# Patient Record
Sex: Female | Born: 1980 | Hispanic: Yes | Smoking: Never smoker
Health system: Southern US, Community
[De-identification: ages and names within clinical notes are randomized; demographics above are authoritative.]

## PROBLEM LIST (undated history)

## (undated) DIAGNOSIS — O139 Gestational [pregnancy-induced] hypertension without significant proteinuria, unspecified trimester: Secondary | ICD-10-CM

## (undated) HISTORY — DX: Gestational (pregnancy-induced) hypertension without significant proteinuria, unspecified trimester: O13.9

---

## 2016-07-01 ENCOUNTER — Encounter: Payer: Self-pay | Admitting: Obstetrics & Gynecology

## 2016-07-09 ENCOUNTER — Other Ambulatory Visit (HOSPITAL_COMMUNITY)
Admission: RE | Admit: 2016-07-09 | Discharge: 2016-07-09 | Disposition: A | Discharge status: Home / Self Care | Payer: Medicaid Other | Source: Ambulatory Visit | Attending: Advanced Practice Midwife | Admitting: Advanced Practice Midwife

## 2016-07-09 ENCOUNTER — Encounter: Payer: Self-pay | Admitting: Advanced Practice Midwife

## 2016-07-09 ENCOUNTER — Ambulatory Visit (INDEPENDENT_AMBULATORY_CARE_PROVIDER_SITE_OTHER): Payer: Medicaid Other | Admitting: Advanced Practice Midwife

## 2016-07-09 ENCOUNTER — Encounter: Payer: Self-pay | Admitting: *Deleted

## 2016-07-09 VITALS — BP 108/74 | HR 86 | Wt 185.6 lb

## 2016-07-09 DIAGNOSIS — Z3483 Encounter for supervision of other normal pregnancy, third trimester: Secondary | ICD-10-CM | POA: Diagnosis not present

## 2016-07-09 DIAGNOSIS — O34219 Maternal care for unspecified type scar from previous cesarean delivery: Secondary | ICD-10-CM | POA: Diagnosis not present

## 2016-07-09 DIAGNOSIS — Z1151 Encounter for screening for human papillomavirus (HPV): Secondary | ICD-10-CM | POA: Insufficient documentation

## 2016-07-09 DIAGNOSIS — Z01419 Encounter for gynecological examination (general) (routine) without abnormal findings: Secondary | ICD-10-CM | POA: Diagnosis present

## 2016-07-09 DIAGNOSIS — Z113 Encounter for screening for infections with a predominantly sexual mode of transmission: Secondary | ICD-10-CM | POA: Insufficient documentation

## 2016-07-09 DIAGNOSIS — Z3480 Encounter for supervision of other normal pregnancy, unspecified trimester: Secondary | ICD-10-CM | POA: Insufficient documentation

## 2016-07-09 LAB — POCT URINALYSIS DIP (DEVICE)
BILIRUBIN URINE: NEGATIVE
GLUCOSE, UA: NEGATIVE mg/dL
Hgb urine dipstick: NEGATIVE
Ketones, ur: NEGATIVE mg/dL
LEUKOCYTES UA: NEGATIVE
NITRITE: NEGATIVE
Protein, ur: NEGATIVE mg/dL
Specific Gravity, Urine: 1.015 (ref 1.005–1.030)
UROBILINOGEN UA: 0.2 mg/dL (ref 0.0–1.0)
pH: 7 (ref 5.0–8.0)

## 2016-07-09 LAB — OB RESULTS CONSOLE GC/CHLAMYDIA: GC PROBE AMP, GENITAL: NEGATIVE

## 2016-07-09 MED ORDER — FERROUS FUMARATE 150 MG PO TABS: 1.0000 | ORAL_TABLET | Freq: Every day | ORAL | Status: DC

## 2016-07-09 NOTE — Progress Notes (Signed)
   Subjective:    Carolyn Hanson is a G2P1001 3636w6d being seen today for her first obstetrical visit.  Her obstetrical history is significant for C/S x 1. Patient does intend to breast feed. Pregnancy history fully reviewed.  Patient reports no complaints.  Filed Vitals:   07/09/16 0852  BP: 108/74  Pulse: 86  Weight: 185 lb 9.6 oz (84.188 kg)    HISTORY: OB History  Gravida Para Term Preterm AB SAB TAB Ectopic Multiple Living  2 1 1       1     # Outcome Date GA Lbr Len/2nd Weight Sex Delivery Anes PTL Lv  2 Current           1 Term 10/15/11   6 lb 7 oz (2.92 kg) F CS-LTranv Spinal N Y     Past Medical History  Diagnosis Date  . Pregnancy induced hypertension    Past Surgical History  Procedure Laterality Date  . Cesarean section     Family History  Problem Relation Age of Onset  . Diabetes Mother   . Hypertension Mother   . Diabetes Paternal Grandfather   . Hypertension Paternal Grandfather      Exam    Uterus:     Pelvic Exam:    Perineum: Normal Perineum   Vulva: normal   Vagina:  normal mucosa, curdlike discharge, wet prep done   pH:    Cervix: no bleeding following Pap, no cervical motion tenderness and no lesions   Adnexa: not evaluated   Bony Pelvis: average  System: Breast:  normal appearance, no masses or tenderness   Skin: normal coloration and turgor, no rashes    Neurologic: oriented, normal, normal mood, gait normal; reflexes normal and symmetric   Extremities: normal strength, tone, and muscle mass, ROM of all joints is normal   HEENT sclera clear, anicteric, neck supple with midline trachea and thyroid without masses   Mouth/Teeth mucous membranes moist, pharynx normal without lesions and dental hygiene good   Neck supple and no masses   Cardiovascular: regular rate and rhythm   Respiratory:  appears well, vitals normal, no respiratory distress, acyanotic, normal RR, ear and throat exam is normal, neck free of mass or  lymphadenopathy, chest clear, no wheezing, crepitations, rhonchi, normal symmetric air entry   Abdomen: soft, non-tender; bowel sounds normal; no masses,  no organomegaly   Urinary: urethral meatus normal      Assessment:    Pregnancy: G2P1001 Patient Active Problem List   Diagnosis Date Noted  . Supervision of normal subsequent pregnancy 07/09/2016  . H/O cesarean section complicating pregnancy 07/09/2016        Plan:     Initial labs drawn. Prenatal vitamins. Problem list reviewed and updated. Genetic Screening discussed : declined.  Ultrasound discussed; fetal survey: ordered.  Follow up in 1 weeks.      LEFTWICH-KIRBY, Lena Fieldhouse 07/09/2016

## 2016-07-09 NOTE — Addendum Note (Signed)
Addended by: Cheree DittoGRAHAM, DEMETRICE A on: 07/09/2016 02:32 PM   Modules accepted: Orders

## 2016-07-09 NOTE — Addendum Note (Signed)
Addended by: Garret ReddishBARNES, Arliss Hepburn M on: 07/09/2016 11:59 AM   Modules accepted: Orders

## 2016-07-09 NOTE — Progress Notes (Signed)
Spanish interpreter [phone]#[phone] used

## 2016-07-10 ENCOUNTER — Encounter: Payer: Self-pay | Admitting: *Deleted

## 2016-07-10 LAB — GC/CHLAMYDIA PROBE AMP (~~LOC~~) NOT AT ARMC
CHLAMYDIA, DNA PROBE: NEGATIVE
NEISSERIA GONORRHEA: NEGATIVE

## 2016-07-10 LAB — WET PREP, GENITAL
Clue Cells Wet Prep HPF POC: NONE SEEN
Trich, Wet Prep: NONE SEEN
Yeast Wet Prep HPF POC: NONE SEEN

## 2016-07-10 LAB — PAIN MGMT, PROFILE 6 CONF W/O MM, U
6 ACETYLMORPHINE: NEGATIVE ng/mL (ref <10)
AMPHETAMINES: NEGATIVE ng/mL (ref <500)
Alcohol Metabolites: NEGATIVE ng/mL (ref <500)
Barbiturates: NEGATIVE ng/mL (ref <300)
Benzodiazepines: NEGATIVE ng/mL (ref <100)
Cocaine Metabolite: NEGATIVE ng/mL (ref <150)
Creatinine: 43 mg/dL (ref >=20.0)
MARIJUANA METABOLITE: NEGATIVE ng/mL (ref <20)
Methadone Metabolite: NEGATIVE ng/mL (ref <100)
OXIDANT: NEGATIVE ug/mL (ref <200)
OXYCODONE: NEGATIVE ng/mL (ref <100)
Opiates: NEGATIVE ng/mL (ref <100)
PH: 6.8 (ref 4.5–9.0)
Phencyclidine: NEGATIVE ng/mL (ref <25)
Please note:: 0

## 2016-07-10 LAB — CYTOLOGY - PAP

## 2016-07-11 ENCOUNTER — Ambulatory Visit (HOSPITAL_COMMUNITY)
Admission: RE | Admit: 2016-07-11 | Discharge: 2016-07-11 | Disposition: A | Discharge status: Home / Self Care | Payer: Medicaid Other | Source: Ambulatory Visit | Attending: Advanced Practice Midwife | Admitting: Advanced Practice Midwife

## 2016-07-11 ENCOUNTER — Other Ambulatory Visit: Payer: Self-pay | Admitting: Advanced Practice Midwife

## 2016-07-11 DIAGNOSIS — Z3483 Encounter for supervision of other normal pregnancy, third trimester: Secondary | ICD-10-CM

## 2016-07-11 DIAGNOSIS — Z36 Encounter for antenatal screening of mother: Secondary | ICD-10-CM | POA: Diagnosis not present

## 2016-07-11 DIAGNOSIS — O358XX1 Maternal care for other (suspected) fetal abnormality and damage, fetus 1: Secondary | ICD-10-CM

## 2016-07-11 DIAGNOSIS — O34219 Maternal care for unspecified type scar from previous cesarean delivery: Secondary | ICD-10-CM | POA: Insufficient documentation

## 2016-07-11 DIAGNOSIS — Z3A37 37 weeks gestation of pregnancy: Secondary | ICD-10-CM | POA: Insufficient documentation

## 2016-07-11 DIAGNOSIS — O35EXX1 Maternal care for other (suspected) fetal abnormality and damage, fetal genitourinary anomalies, fetus 1: Secondary | ICD-10-CM

## 2016-07-11 DIAGNOSIS — Z3689 Encounter for other specified antenatal screening: Secondary | ICD-10-CM

## 2016-07-11 LAB — PRENATAL PROFILE (SOLSTAS)
Antibody Screen: NEGATIVE
BASOS PCT: 0 %
Basophils Absolute: 0 cells/uL (ref 0–200)
Eosinophils Absolute: 104 cells/uL (ref 15–500)
Eosinophils Relative: 1 %
HEMATOCRIT: 43.4 % (ref 35.0–45.0)
HEP B S AG: NEGATIVE
HIV: NONREACTIVE
Hemoglobin: 13.8 g/dL (ref 11.7–15.5)
LYMPHS ABS: 1664 {cells}/uL (ref 850–3900)
Lymphocytes Relative: 16 %
MCH: 30.4 pg (ref 27.0–33.0)
MCHC: 31.8 g/dL — AB (ref 32.0–36.0)
MCV: 95.6 fL (ref 80.0–100.0)
MONO ABS: 312 {cells}/uL (ref 200–950)
MPV: 10 fL (ref 7.5–12.5)
Monocytes Relative: 3 %
Neutro Abs: 8320 cells/uL — ABNORMAL HIGH (ref 1500–7800)
Neutrophils Relative %: 80 %
Platelets: 266 10*3/uL (ref 140–400)
RBC: 4.54 MIL/uL (ref 3.80–5.10)
RDW: 13.8 % (ref 11.0–15.0)
Rh Type: POSITIVE
Rubella: 3.8 Index — ABNORMAL HIGH (ref <0.90)
WBC: 10.4 10*3/uL (ref 3.8–10.8)

## 2016-07-11 LAB — CULTURE, OB URINE

## 2016-07-12 LAB — HEMOGLOBINOPATHY EVALUATION
HEMATOCRIT: 43.4 % (ref 35.0–45.0)
Hemoglobin: 13.8 g/dL (ref 11.7–15.5)
Hgb A2 Quant: 2.1 % (ref 1.8–3.5)
Hgb A: 96.9 % (ref >96.0)
MCH: 30.4 pg (ref 27.0–33.0)
MCV: 95.6 fL (ref 80.0–100.0)
RBC: 4.54 MIL/uL (ref 3.80–5.10)
RDW: 13.8 % (ref 11.0–15.0)

## 2016-07-16 DIAGNOSIS — O35EXX0 Maternal care for other (suspected) fetal abnormality and damage, fetal genitourinary anomalies, not applicable or unspecified: Secondary | ICD-10-CM | POA: Insufficient documentation

## 2016-07-16 DIAGNOSIS — O358XX0 Maternal care for other (suspected) fetal abnormality and damage, not applicable or unspecified: Secondary | ICD-10-CM | POA: Insufficient documentation

## 2016-07-16 LAB — GLUCOSE TOLERANCE, 1 HOUR (50G) W/O FASTING: GLUCOSE, 1 HR, GESTATIONAL: 137 mg/dL (ref <140)

## 2016-07-18 ENCOUNTER — Other Ambulatory Visit (HOSPITAL_COMMUNITY)
Admission: RE | Admit: 2016-07-18 | Discharge: 2016-07-18 | Disposition: A | Discharge status: Home / Self Care | Payer: Medicaid Other | Source: Ambulatory Visit | Attending: Family | Admitting: Family

## 2016-07-18 ENCOUNTER — Ambulatory Visit (INDEPENDENT_AMBULATORY_CARE_PROVIDER_SITE_OTHER): Payer: Medicaid Other | Admitting: Student

## 2016-07-18 ENCOUNTER — Telehealth: Payer: Self-pay

## 2016-07-18 VITALS — BP 121/80 | HR 100 | Wt 186.8 lb

## 2016-07-18 DIAGNOSIS — O34219 Maternal care for unspecified type scar from previous cesarean delivery: Secondary | ICD-10-CM

## 2016-07-18 DIAGNOSIS — Z113 Encounter for screening for infections with a predominantly sexual mode of transmission: Secondary | ICD-10-CM | POA: Insufficient documentation

## 2016-07-18 DIAGNOSIS — Z3483 Encounter for supervision of other normal pregnancy, third trimester: Secondary | ICD-10-CM

## 2016-07-18 LAB — POCT URINALYSIS DIP (DEVICE)
BILIRUBIN URINE: NEGATIVE
Glucose, UA: NEGATIVE mg/dL
Hgb urine dipstick: NEGATIVE
Ketones, ur: NEGATIVE mg/dL
NITRITE: NEGATIVE
PH: 7 (ref 5.0–8.0)
PROTEIN: NEGATIVE mg/dL
Specific Gravity, Urine: 1.02 (ref 1.005–1.030)
Urobilinogen, UA: 0.2 mg/dL (ref 0.0–1.0)

## 2016-07-18 NOTE — Telephone Encounter (Signed)
Patient is aware of lab results and has been scheduled for her 3 hour gtt on 07/22/2016.

## 2016-07-18 NOTE — Progress Notes (Signed)
  Subjective:  Carolyn Hanson is a 35 y.o. G2P1001 at 6783w2d being seen today for ongoing prenatal care.  She is currently monitored for the following issues for this high-risk pregnancy: has Supervision of normal subsequent pregnancy; H/O cesarean section complicating pregnancy; and Multicystic dysplastic kidney, fetal, affecting care of mother, antepartum on her problem list.  Patient reports no complaints. Denies contractions, LOF, or vaginal bleeding. Positive fetal movement.   The following portions of the patient's history were reviewed and updated as appropriate: allergies, current medications, past family history, past medical history, past social history, past surgical history and problem list. Problem list updated.  Objective:   Filed Vitals:   07/18/16 0938  BP: 121/80  Pulse: 100  Weight: 186 lb 12.8 oz (84.732 kg)     Fetal Status:   General:  Alert, oriented and cooperative. Patient is in no acute distress.  Skin: Skin is warm and dry. No rash noted.   Cardiovascular: Normal heart rate noted  Respiratory: Normal respiratory effort, no problems with respiration noted  Abdomen: Soft, gravid, appropriate for gestational age. Fundal height 39 cm    Pelvic:  Cervical exam performed  Dilation: Fingertip Effacement (%): Thick Station: -3 Presentation: Vertex   Extremities: Normal range of motion.   Mental Status: Normal mood and affect. Normal behavior. Normal judgment and thought content.   Urinalysis: Negative for protein & glucose  Assessment and Plan:  Pregnancy: G2P1001 at 7783w2d   1. Encounter for supervision of other normal pregnancy in third trimester  - Culture, beta strep (group b only) - [facility]Chlam[facility] amp ()[facility]  Term labor symptoms and general obstetric precautions including but not limited to vaginal bleeding, contractions, leaking of fluid and fetal movement were reviewed in detail with the patient. Please refer to After  Visit Summary for other counseling recommendations.  Return in about 1 week (around 07/25/2016) for Routine OB.  Judeth HornErin Kamaree Berkel, NP

## 2016-07-18 NOTE — Patient Instructions (Signed)
Tercer trimestre de Psychiatristembarazo (Third Trimester of Pregnancy) El tercer trimestre comprende desde la semana29 hasta la semana42, es decir, desde el mes7 hasta el [address]. En este trimestre, el feto crece muy rpido. Hacia el final del noveno mes, el feto mide alrededor de 20pulgadas (45cm) de largo y pesa entre 6y 10libras [phone](2,700y 4,500kg).  CUIDADOS EN EL HOGAR   No fume, no consuma hierbas ni beba alcohol. No tome frmacos que el mdico no haya autorizado.  No consuma ningn producto que contenga tabaco, lo que incluye cigarrillos, tabaco de Theatre managermascar o Administrator, Civil Servicecigarrillos electrnicos. Si necesita ayuda para dejar de fumar, consulte al American Expressmdico. Puede recibir asesoramiento u otro tipo de apoyo para dejar de fumar.  Tome los medicamentos solamente como se lo haya indicado el mdico. Algunos medicamentos son seguros para tomar durante el Psychiatristembarazo y otros no lo son.  Haga ejercicios solamente como se lo haya indicado el mdico. Interrumpa la actividad fsica si comienza a tener calambres.  Ingiera alimentos saludables de Banks Springsmanera regular.  Use un sostn que le brinde buen soporte si sus mamas estn sensibles.  No se d baos de inmersin en agua caliente, baos turcos ni saunas.  Colquese el cinturn de seguridad cuando conduzca.  No coma carne cruda ni queso sin cocinar; evite el contacto con las bandejas sanitarias de los gatos y la tierra que estos animales usan.  Tome las vitaminas prenatales.  Tome entre 1500 y 2000mg  de calcio diariamente comenzando en la semana20 del embarazo [place] el parto.  Pruebe tomar un medicamento que la ayude a defecar (un laxante suave) si el mdico lo autoriza. Consuma ms fibra, que se encuentra en las frutas y verduras frescas y los cereales integrales. Beba suficiente lquido para mantener el pis (orina) claro o de color amarillo plido.  Dese baos de asiento con agua tibia para Engineer, materialsaliviar el dolor o las molestias causadas por las hemorroides. Use una crema  para las hemorroides si el mdico la autoriza.  Si se le hinchan las venas (venas varicosas), use medias de descanso. Levante (eleve) los pies durante 15minutos, 3 o 4veces por Futures traderda. Limite el consumo de sal en su dieta.  No levante objetos pesados, use zapatos de tacones bajos y sintese derecha.  Descanse con las piernas elevadas si tiene calambres o dolor de cintura.  Visite a su dentista si no lo ha Occupational hygienisthecho durante el embarazo. Use un cepillo de cerdas suaves para cepillarse los dientes. Psese el hilo dental con suavidad.  Puede seguir Calpine Corporationmanteniendo relaciones sexuales, a menos que el mdico le indique lo contrario.  No haga viajes de larga distancia, excepto si es obligatorio y solamente con la aprobacin del mdico.  Tome clases prenatales.  Practique ir manejando al hospital.  Prepare el bolso que llevar al hospital.  Prepare la habitacin del beb.  Concurra a los controles mdicos. SOLICITE AYUDA SI: 1. No est segura de si est en trabajo de parto o si ha roto la bolsa de las aguas. 2. Tiene mareos. 3. Siente calambres leves o presin en la parte inferior del abdomen. 4. Sufre un dolor persistente en el abdomen. 5. Tiene malestar estomacal (nuseas), vmitos, o tiene deposiciones acuosas (diarrea). 6. Advierte un olor ftido que proviene de la vagina. 7. Siente dolor al ConocoPhillipsorinar. SOLICITE AYUDA DE INMEDIATO SI:   Tiene fiebre.  Tiene una prdida de lquido por la vagina.  Tiene sangrado o pequeas prdidas vaginales.  Siente dolor intenso o clicos en el abdomen.  Sube o baja de peso rpidamente.  Tiene dificultades para recuperar el aliento y siente dolor en el pecho.  Sbitamente se le hinchan mucho el rostro, las [place], los tobillos, los pies o las piernas.  No ha sentido los movimientos del beb durante Carolyn Hanson.  Siente un dolor de cabeza intenso que no se alivia con medicamentos.  Su visin se modifica.   Esta informacin no tiene Theme park manager  el consejo del mdico. Asegrese de hacerle al mdico cualquier pregunta que tenga.   Document Released: 08/18/2013 Document Revised: 01/06/2015 Elsevier Interactive Patient Education 2016 [employer].   Evaluacin de los movimientos fetales  (Fetal Movement Counts) Nombre del paciente: __________________________________________________ Carolyn Hanson estimada: ____________________ Carolyn Hanson de los movimientos fetales es muy recomendable en los embarazos de alto riesgo, pero tambin es una buena idea que lo hagan todas las Carolyn Hanson. El Firefighter que comience a contarlos a las 28 semanas de [place]. Los movimientos fetales suelen aumentar:   Despus de Animator.  Despus de la actividad fsica.  Despus de comer o beber [employer] o fro.  En reposo. Preste atencin cuando sienta que el beb est ms activo. Esto le ayudar a notar un patrn de ciclos de vigilia y sueo de su beb y cules son los factores que contribuyen a un aumento de los movimientos fetales. Es importante llevar a cabo un recuento de movimientos fetales, al mismo tiempo cada da, cuando el beb normalmente est ms activo.  CMO CONTAR LOS MOVIMIENTOS FETALES 8. Busque un lugar tranquilo y cmodo para sentarse o recostarse sobre el lado izquierdo. Al recostarse sobre su lado izquierdo, le proporciona una mejor circulacin de [place] y oxgeno al beb. 9. Anote el da y la hora en una hoja de papel o en un diario. 10. Comience contando las pataditas, revoloteos, chasquidos, vueltas o pinchazos en un perodo de 2 horas. Debe sentir al menos 10 movimientos en 2 horas. 11. Si no siente 10 movimientos en 2 horas, espere 2  3 horas y cuente de nuevo. Busque cambios en el patrn o si no cuenta lo suficiente en 2 horas. SOLICITE ATENCIN MDICA SI:   Siente menos de 10 pataditas en 2 horas, en dos intentos.  No hay movimientos durante una hora.  El patrn se modifica o le lleva ms tiempo Hospital doctor las 10 pataditas.  Siente que el beb no se mueve como lo hace habitualmente. Fecha: ____________ Movimientos: ____________ Carolyn Hanson inicio: ____________ Carolyn Hanson finalizacin: ____________  Carolyn Hanson: ____________ Movimientos: ____________ Carolyn Hanson inicio: ____________ Carolyn Hanson finalizacin: ____________  Carolyn Hanson: ____________ Movimientos: ____________ Carolyn Hanson inicio: ____________ Carolyn Hanson finalizacin: ____________  Carolyn Hanson: ____________ Movimientos: ____________ Carolyn Hanson inicio: ____________ Carolyn Hanson finalizacin: ____________  Carolyn Hanson: ____________ Movimientos: ____________ Carolyn Hanson inicio: ____________ Carolyn Hanson de finalizacin: ____________  Carolyn Hanson: ____________ Movimientos: ____________ Carolyn Hanson de inicio: ____________ Carolyn Hanson de finalizacin: ____________  Carolyn Hanson: ____________ Movimientos: ____________ Carolyn Hanson de inicio: ____________ Carolyn Hanson de finalizacin: ____________  Carolyn Hanson: ____________ Movimientos: ____________ Carolyn Hanson de inicio: ____________ Carolyn Hanson de finalizacin: ____________  Carolyn Hanson: ____________ Movimientos: ____________ Carolyn Hanson de inicio: ____________ Carolyn Hanson de finalizacin: ____________  Carolyn Hanson: ____________ Movimientos: ____________ Carolyn Hanson de inicio: ____________ Carolyn Hanson de finalizacin: ____________  Carolyn Hanson: ____________ Movimientos: ____________ Carolyn Hanson de inicio: ____________ Carolyn Hanson de finalizacin: ____________  Carolyn Hanson: ____________ Movimientos: ____________ Carolyn Hanson inicio: ____________ Carolyn Hanson finalizacin: ____________  Carolyn Hanson: ____________ Movimientos: ____________ Carolyn Hanson inicio: ____________ Carolyn Hanson finalizacin: ____________  Carolyn Hanson: ____________ Movimientos: ____________ Carolyn Hanson inicio: ____________ Carolyn Hanson finalizacin: ____________  Carolyn Hanson: ____________ Movimientos: ____________  Hora de inicio: ____________ Carolyn Hanson de finalizacin: ____________  Carolyn Hanson: ____________ Movimientos: ____________ Carolyn Hanson inicio: ____________ Carolyn Hanson finalizacin: ____________  Carolyn Hanson: ____________ Movimientos: ____________ Carolyn Hanson inicio:  ____________ Carolyn Hanson finalizacin: ____________  Carolyn Hanson: ____________ Movimientos: ____________ Carolyn Hanson inicio: ____________ Carolyn Hanson finalizacin: ____________  Carolyn Hanson: ____________ Movimientos: ____________ Carolyn Hanson inicio: ____________ Carolyn Hanson finalizacin: ____________  Carolyn Hanson: ____________ Movimientos: ____________ Carolyn Hanson inicio: ____________ Carolyn Hanson de finalizacin: ____________  Carolyn Hanson: ____________ Movimientos: ____________ Carolyn Hanson de inicio: ____________ Carolyn Hanson de finalizacin: ____________  Carolyn Hanson: ____________ Movimientos: ____________ Carolyn Hanson de inicio: ____________ Carolyn Hanson de finalizacin: ____________  Carolyn Hanson: ____________ Movimientos: ____________ Carolyn Hanson de inicio: ____________ Carolyn Hanson de finalizacin: ____________  Carolyn Hanson: ____________ Movimientos: ____________ Carolyn Hanson de inicio: ____________ Carolyn Hanson de finalizacin: ____________  Carolyn Hanson: ____________ Movimientos: ____________ Carolyn Hanson de inicio: ____________ Carolyn Hanson de finalizacin: ____________  Carolyn Hanson: ____________ Movimientos: ____________ Carolyn Hanson de inicio: ____________ Carolyn Hanson de finalizacin: ____________  Carolyn Hanson: ____________ Movimientos: ____________ Carolyn Hanson de inicio: ____________ Carolyn Hanson de finalizacin: ____________  Carolyn Hanson: ____________ Movimientos: ____________ Carolyn Hanson de inicio: ____________ Carolyn Hanson de finalizacin: ____________  Carolyn Hanson: ____________ Movimientos: ____________ Carolyn Hanson de inicio: ____________ Carolyn Hanson de finalizacin: ____________  Carolyn Hanson: ____________ Movimientos: ____________ Carolyn Hanson de inicio: ____________ Carolyn Hanson de finalizacin: ____________  Carolyn Hanson: ____________ Movimientos: ____________ Carolyn Hanson de inicio: ____________ Carolyn Hanson de finalizacin: ____________  Carolyn Hanson: ____________ Movimientos: ____________ Carolyn Hanson de inicio: ____________ Carolyn Hanson de finalizacin: ____________  Carolyn Hanson: ____________ Movimientos: ____________ Carolyn Hanson de inicio: ____________ Carolyn Hanson de finalizacin: ____________  Carolyn Hanson: ____________ Movimientos: ____________ Carolyn Hanson de inicio: ____________ Carolyn Hanson de finalizacin:  ____________  Carolyn Hanson: ____________ Movimientos: ____________ Carolyn Hanson de inicio: ____________ Carolyn Hanson de finalizacin: ____________  Carolyn Hanson: ____________ Movimientos: ____________ Carolyn Hanson de inicio: ____________ Carolyn Hanson de finalizacin: ____________  Carolyn Hanson: ____________ Movimientos: ____________ Carolyn Hanson de inicio: ____________ Carolyn Hanson de finalizacin: ____________  Carolyn Hanson: ____________ Movimientos: ____________ Carolyn Hanson de inicio: ____________ Carolyn Hanson de finalizacin: ____________  Carolyn Hanson: ____________ Movimientos: ____________ Carolyn Hanson de inicio: ____________ Carolyn Hanson de finalizacin: ____________  Carolyn Hanson: ____________ Movimientos: ____________ Carolyn Hanson de inicio: ____________ Carolyn Hanson de finalizacin: ____________  Carolyn Hanson: ____________ Movimientos: ____________ Carolyn Hanson de inicio: ____________ Carolyn Hanson de finalizacin: ____________  Carolyn Hanson: ____________ Movimientos: ____________ Carolyn Hanson de inicio: ____________ Carolyn Hanson de finalizacin: ____________  Carolyn Hanson: ____________ Movimientos: ____________ Carolyn Hanson de inicio: ____________ Carolyn Hanson de finalizacin: ____________  Carolyn Hanson: ____________ Movimientos: ____________ Carolyn Hanson de inicio: ____________ Carolyn Hanson de finalizacin: ____________  Carolyn Hanson: ____________ Movimientos: ____________ Carolyn Hanson de inicio: ____________ Carolyn Hanson de finalizacin: ____________  Carolyn Hanson: ____________ Movimientos: ____________ Carolyn Hanson de inicio: ____________ Carolyn Hanson de finalizacin: ____________  Carolyn Hanson: ____________ Movimientos: ____________ Carolyn Hanson de inicio: ____________ Carolyn Hanson de finalizacin: ____________  Carolyn Hanson: ____________ Movimientos: ____________ Carolyn Hanson de inicio: ____________ Carolyn Hanson de finalizacin: ____________  Carolyn Hanson: ____________ Movimientos: ____________ Carolyn Hanson de inicio: ____________ Carolyn Hanson de finalizacin: ____________  Carolyn Hanson: ____________ Movimientos: ____________ Carolyn Hanson de inicio: ____________ Carolyn Hanson de finalizacin: ____________  Carolyn Hanson: ____________ Movimientos: ____________ Carolyn Hanson de inicio: ____________ Carolyn Hanson de finalizacin: ____________  Carolyn Hanson: ____________  Movimientos: ____________ Carolyn Hanson de inicio: ____________ Carolyn Hanson de finalizacin: ____________  Carolyn Hanson: ____________ Movimientos: ____________ Carolyn Hanson de inicio: ____________ Carolyn Hanson de finalizacin: ____________  Carolyn Hanson: ____________ Movimientos: ____________ Carolyn Hanson de inicio: ____________ Carolyn Hanson de finalizacin: ____________  Carolyn Hanson: ____________ Movimientos: ____________ Carolyn Hanson de inicio: ____________ Carolyn Hanson de finalizacin: ____________  Carolyn Hanson: ____________ Movimientos: ____________ Carolyn Hanson de inicio: ____________ Carolyn Hanson de finalizacin: ____________    Esta informacin no tiene como fin reemplazar el consejo del mdico. Asegrese de hacerle al mdico cualquier pregunta que tenga.   Document Released: 03/24/2008 Document Revised: 12/02/2012 Elsevier Interactive Patient Education [employer]

## 2016-07-19 ENCOUNTER — Telehealth: Payer: Self-pay | Admitting: *Deleted

## 2016-07-19 ENCOUNTER — Inpatient Hospital Stay (HOSPITAL_COMMUNITY)
Admission: AD | Admit: 2016-07-19 | Discharge: 2016-07-22 | DRG: 766 | Disposition: A | Discharge status: Home / Self Care | Payer: Medicaid Other | Source: Ambulatory Visit | Attending: Family Medicine | Admitting: Family Medicine

## 2016-07-19 ENCOUNTER — Encounter (HOSPITAL_COMMUNITY): Payer: Self-pay

## 2016-07-19 DIAGNOSIS — Z8249 Family history of ischemic heart disease and other diseases of the circulatory system: Secondary | ICD-10-CM

## 2016-07-19 DIAGNOSIS — Z3A38 38 weeks gestation of pregnancy: Secondary | ICD-10-CM

## 2016-07-19 DIAGNOSIS — Z3483 Encounter for supervision of other normal pregnancy, third trimester: Secondary | ICD-10-CM

## 2016-07-19 DIAGNOSIS — Z833 Family history of diabetes mellitus: Secondary | ICD-10-CM | POA: Diagnosis not present

## 2016-07-19 DIAGNOSIS — O4290 Premature rupture of membranes, unspecified as to length of time between rupture and onset of labor, unspecified weeks of gestation: Secondary | ICD-10-CM | POA: Diagnosis present

## 2016-07-19 DIAGNOSIS — O34219 Maternal care for unspecified type scar from previous cesarean delivery: Secondary | ICD-10-CM | POA: Diagnosis present

## 2016-07-19 DIAGNOSIS — O4292 Full-term premature rupture of membranes, unspecified as to length of time between rupture and onset of labor: Secondary | ICD-10-CM | POA: Diagnosis present

## 2016-07-19 DIAGNOSIS — O34211 Maternal care for low transverse scar from previous cesarean delivery: Secondary | ICD-10-CM | POA: Diagnosis present

## 2016-07-19 DIAGNOSIS — O35EXX1 Maternal care for other (suspected) fetal abnormality and damage, fetal genitourinary anomalies, fetus 1: Secondary | ICD-10-CM

## 2016-07-19 DIAGNOSIS — O4202 Full-term premature rupture of membranes, onset of labor within 24 hours of rupture: Secondary | ICD-10-CM

## 2016-07-19 DIAGNOSIS — Z23 Encounter for immunization: Secondary | ICD-10-CM | POA: Diagnosis not present

## 2016-07-19 DIAGNOSIS — O358XX1 Maternal care for other (suspected) fetal abnormality and damage, fetus 1: Secondary | ICD-10-CM

## 2016-07-19 LAB — URINE MICROSCOPIC-ADD ON

## 2016-07-19 LAB — POCT FERN TEST: POCT Fern Test: POSITIVE

## 2016-07-19 LAB — URINALYSIS, ROUTINE W REFLEX MICROSCOPIC
Bilirubin Urine: NEGATIVE
GLUCOSE, UA: NEGATIVE mg/dL
Ketones, ur: NEGATIVE mg/dL
LEUKOCYTES UA: NEGATIVE
Nitrite: NEGATIVE
PH: 5.5 (ref 5.0–8.0)
PROTEIN: NEGATIVE mg/dL
Specific Gravity, Urine: <1.005 — ABNORMAL LOW (ref 1.005–1.030)

## 2016-07-19 LAB — CBC
HCT: 39.2 % (ref 36.0–46.0)
HEMOGLOBIN: 13.8 g/dL (ref 12.0–15.0)
MCH: 30.3 pg (ref 26.0–34.0)
MCHC: 35.2 g/dL (ref 30.0–36.0)
MCV: 86.2 fL (ref 78.0–100.0)
PLATELETS: 230 10*3/uL (ref 150–400)
RBC: 4.55 MIL/uL (ref 3.87–5.11)
RDW: 13.2 % (ref 11.5–15.5)
WBC: 10.5 10*3/uL (ref 4.0–10.5)

## 2016-07-19 MED ORDER — SOD CITRATE-CITRIC ACID 500-334 MG/5ML PO SOLN
30.0000 mL | ORAL | Status: DC | PRN
  Administered 2016-07-20: 30 mL via ORAL
  Filled 2016-07-19: qty 15

## 2016-07-19 MED ORDER — OXYTOCIN BOLUS FROM INFUSION: 500.0000 mL | INTRAVENOUS | Status: DC

## 2016-07-19 MED ORDER — OXYCODONE-ACETAMINOPHEN 5-325 MG PO TABS: 2.0000 | ORAL_TABLET | ORAL | Status: DC | PRN

## 2016-07-19 MED ORDER — OXYCODONE-ACETAMINOPHEN 5-325 MG PO TABS: 1.0000 | ORAL_TABLET | ORAL | Status: DC | PRN

## 2016-07-19 MED ORDER — ONDANSETRON HCL 4 MG/2ML IJ SOLN: 4.0000 mg | Freq: Four times a day (QID) | INTRAMUSCULAR | Status: DC | PRN

## 2016-07-19 MED ORDER — OXYTOCIN 40 UNITS IN LACTATED RINGERS INFUSION - SIMPLE MED: 2.5000 [IU]/h | INTRAVENOUS | Status: DC

## 2016-07-19 MED ORDER — LACTATED RINGERS IV SOLN
INTRAVENOUS | Status: DC
  Administered 2016-07-19: 23:00:00 via INTRAVENOUS

## 2016-07-19 MED ORDER — LIDOCAINE HCL (PF) 1 % IJ SOLN: 30.0000 mL | INTRAMUSCULAR | Status: DC | PRN

## 2016-07-19 MED ORDER — ACETAMINOPHEN 325 MG PO TABS: 650.0000 mg | ORAL_TABLET | ORAL | Status: DC | PRN

## 2016-07-19 MED ORDER — LACTATED RINGERS IV SOLN
500.0000 mL | INTRAVENOUS | Status: DC | PRN
Start: 2016-07-19 — End: 2016-07-20
  Administered 2016-07-20: 500 mL via INTRAVENOUS
  Administered 2016-07-20: 1000 mL via INTRAVENOUS

## 2016-07-19 NOTE — Telephone Encounter (Signed)
Received message from pt's niece stating that she had taken pt to [facility] to pick up Rx and it was not available. I returned the call and we discussed the situation.  Pt did not pick up Rx within a week for iron tablets which were prescribed on 7/11.  I explained that the pharmacy should still have the prescription and could fill it now.  She voiced understanding and stated that she will notify the pt.

## 2016-07-19 NOTE — MAU Note (Signed)
Had some vag bleeding and fld leaking about ago. Some abd cramping. Prior c/s but desire TOLAC

## 2016-07-19 NOTE — H&P (Signed)
LABOR AND DELIVERY ADMISSION HISTORY AND PHYSICAL NOTE  Carolyn Hanson is a 35 y.o. female G2P1001 with IUP at 3479w2d by LMP presenting for PROM at 2200. TOLAC, hx of c/s for HTN at 40weeks, did not labor.  She reports positive fetal movement. She denies leakage of fluid or vaginal bleeding.  Prenatal History/Complications:  Past Medical History: Past Medical History  Diagnosis Date  . Pregnancy induced hypertension     Past Surgical History: Past Surgical History  Procedure Laterality Date  . Cesarean section      Obstetrical History: OB History    Gravida Para Term Preterm AB TAB SAB Ectopic Multiple Living   2 1 1       1       Social History: Social History   Social History  . Marital Status: Unknown    Spouse Name: N/A  . Number of Children: N/A  . Years of Education: N/A   Social History Main Topics  . Smoking status: Never Smoker   . Smokeless tobacco: Never Used  . Alcohol Use: No  . Drug Use: No  . Sexual Activity: Not on file   Other Topics Concern  . Not on file   Social History Narrative  . No narrative on file    Family History: Family History  Problem Relation Age of Onset  . Diabetes Mother   . Hypertension Mother   . Diabetes Paternal Grandfather   . Hypertension Paternal Grandfather     Allergies: No Known Allergies  Prescriptions prior to admission  Medication Sig Dispense Refill Last Dose  . Ferrous Fumarate 150 MG TABS Take 1 tablet (150 mg total) by mouth daily. 30 each 0 Taking  . FOLIC ACID PO Take 0.4 mg by mouth daily.   Taking     Review of Systems   All systems reviewed and negative except as stated in HPI  Blood pressure 134/87, pulse 93, temperature 98.3 F (36.8 C), resp. rate 18, height 4' 10.25" (1.48 m), weight 86.002 kg (189 lb 9.6 oz), last menstrual period 10/25/2015. General appearance: alert, cooperative and no distress Lungs: clear to auscultation bilaterally Heart: regular rate and  rhythm Abdomen: soft, non-tender; bowel sounds normal Extremities: No calf swelling or tenderness Presentation: cephalic  Fetal monitoring: cat 1 Uterine activity: ctx q5  Dilation: 1 Effacement (%): Thick Station: -3 Exam by:: Sharen Hintaroline Brewer RNC   Prenatal labs: ABO, Rh: A/POS/-- (07/11 0955) Antibody: NEG (07/11 0955) Rubella: !Error! RPR: NON REAC (07/11 0955)  HBsAg: NEGATIVE (07/11 0955)  HIV: NONREACTIVE (07/11 0955)  GBS:   pending 1 hr Glucola: 137 passed  Genetic screening:  declined Anatomy US: fetal Left multicystic dysplastic kidney  Prenatal Transfer Tool  Maternal Diabetes: No Genetic Screening: Declined Maternal Ultrasounds/Referrals: Normal Fetal Ultrasounds or other Referrals:  None Maternal Substance Abuse:  No Significant Maternal Medications:  None Significant Maternal Lab Results: None  Results for orders placed or performed during the hospital encounter of 07/19/16 (from the past 24 hour(s))  Fern Test   Collection Time: 07/19/16 10:55 PM  Result Value Ref Range   POCT Fern Test Positive = ruptured amniotic membanes     Patient Active Problem List   Diagnosis Date Noted  . Multicystic dysplastic kidney, fetal, affecting care of mother, antepartum 07/16/2016  . Supervision of normal subsequent pregnancy 07/09/2016  . H/O cesarean section complicating pregnancy 07/09/2016    Assessment: Carolyn Hanson is a 35 y.o. G2P1001 at 1779w2d here for PROM@2200  7/21.  #Labor:expectant  management #Pain: undecided #FWB: Cat 1 #ID:  GBS unk, PCR pending #MOF: breast #MOC:IUD #TOLAC  Loni Muse 07/19/2016, 11:00 PM  OB FELLOW HISTORY AND PHYSICAL ATTESTATION  I have seen and examined this patient; I agree with above documentation in the resident's note.    Cherrie Gauze Nicko Daher 07/20/2016, 3:30 AM

## 2016-07-20 ENCOUNTER — Inpatient Hospital Stay (HOSPITAL_COMMUNITY): Payer: Medicaid Other | Admitting: Anesthesiology

## 2016-07-20 ENCOUNTER — Encounter (HOSPITAL_COMMUNITY): Payer: Self-pay | Admitting: Anesthesiology

## 2016-07-20 ENCOUNTER — Encounter (HOSPITAL_COMMUNITY)
Admission: AD | Disposition: A | Discharge status: Home / Self Care | Payer: Self-pay | Source: Ambulatory Visit | Attending: Family Medicine

## 2016-07-20 DIAGNOSIS — Z3A38 38 weeks gestation of pregnancy: Secondary | ICD-10-CM

## 2016-07-20 DIAGNOSIS — O34219 Maternal care for unspecified type scar from previous cesarean delivery: Secondary | ICD-10-CM

## 2016-07-20 LAB — TYPE AND SCREEN
ABO/RH(D): A POS
ANTIBODY SCREEN: NEGATIVE

## 2016-07-20 LAB — ABO/RH: ABO/RH(D): A POS

## 2016-07-20 LAB — RPR: RPR Ser Ql: NONREACTIVE

## 2016-07-20 LAB — CULTURE, BETA STREP (GROUP B ONLY)

## 2016-07-20 LAB — OB RESULTS CONSOLE GBS: GBS: NEGATIVE

## 2016-07-20 LAB — GROUP B STREP BY PCR: GROUP B STREP BY PCR: NEGATIVE

## 2016-07-20 SURGERY — Surgical Case
Anesthesia: Epidural

## 2016-07-20 MED ORDER — LACTATED RINGERS IV SOLN
INTRAVENOUS | Status: DC
Start: 2016-07-20 — End: 2016-07-22
  Administered 2016-07-21 (×2): via INTRAVENOUS

## 2016-07-20 MED ORDER — FENTANYL CITRATE (PF) 100 MCG/2ML IJ SOLN
100.0000 ug | INTRAMUSCULAR | Status: DC | PRN
  Administered 2016-07-20 (×2): 100 ug via INTRAVENOUS
  Filled 2016-07-20 (×3): qty 2

## 2016-07-20 MED ORDER — SODIUM CHLORIDE 0.9 % IR SOLN
Status: DC | PRN
  Administered 2016-07-20: 1000 mL

## 2016-07-20 MED ORDER — SODIUM BICARBONATE 8.4 % IV SOLN
INTRAVENOUS | Status: DC | PRN
  Administered 2016-07-20: 5 mL via EPIDURAL

## 2016-07-20 MED ORDER — DIPHENHYDRAMINE HCL 50 MG/ML IJ SOLN: 12.5000 mg | INTRAMUSCULAR | Status: DC | PRN

## 2016-07-20 MED ORDER — LACTATED RINGERS IV SOLN
INTRAVENOUS | Status: DC
  Administered 2016-07-20: 150 mL/h via INTRAUTERINE

## 2016-07-20 MED ORDER — SIMETHICONE 80 MG PO CHEW: 80.0000 mg | CHEWABLE_TABLET | ORAL | Status: DC | PRN

## 2016-07-20 MED ORDER — OXYCODONE-ACETAMINOPHEN 5-325 MG PO TABS: 1.0000 | ORAL_TABLET | ORAL | Status: DC | PRN

## 2016-07-20 MED ORDER — KETOROLAC TROMETHAMINE 30 MG/ML IJ SOLN: 30.0000 mg | Freq: Four times a day (QID) | INTRAMUSCULAR | Status: AC | PRN

## 2016-07-20 MED ORDER — SENNOSIDES-DOCUSATE SODIUM 8.6-50 MG PO TABS
2.0000 | ORAL_TABLET | ORAL | Status: DC
  Administered 2016-07-20 – 2016-07-21 (×2): 2 via ORAL
  Filled 2016-07-20 (×2): qty 2

## 2016-07-20 MED ORDER — SODIUM CHLORIDE 0.9% FLUSH: 3.0000 mL | INTRAVENOUS | Status: DC | PRN

## 2016-07-20 MED ORDER — OXYTOCIN 40 UNITS IN LACTATED RINGERS INFUSION - SIMPLE MED: 2.5000 [IU]/h | INTRAVENOUS | Status: AC

## 2016-07-20 MED ORDER — PROMETHAZINE HCL 25 MG/ML IJ SOLN: 6.2500 mg | INTRAMUSCULAR | Status: DC | PRN

## 2016-07-20 MED ORDER — NALOXONE HCL 0.4 MG/ML IJ SOLN: 0.4000 mg | INTRAMUSCULAR | Status: DC | PRN

## 2016-07-20 MED ORDER — DEXAMETHASONE SODIUM PHOSPHATE 10 MG/ML IJ SOLN
INTRAMUSCULAR | Status: AC
  Filled 2016-07-20: qty 1

## 2016-07-20 MED ORDER — MEPERIDINE HCL 25 MG/ML IJ SOLN: 6.2500 mg | INTRAMUSCULAR | Status: DC | PRN

## 2016-07-20 MED ORDER — EPHEDRINE 5 MG/ML INJ: 10.0000 mg | INTRAVENOUS | Status: DC | PRN

## 2016-07-20 MED ORDER — NALBUPHINE HCL 10 MG/ML IJ SOLN: 5.0000 mg | Freq: Once | INTRAMUSCULAR | Status: DC | PRN

## 2016-07-20 MED ORDER — DIPHENHYDRAMINE HCL 25 MG PO CAPS
25.0000 mg | ORAL_CAPSULE | ORAL | Status: DC | PRN
  Filled 2016-07-20: qty 1

## 2016-07-20 MED ORDER — SCOPOLAMINE 1 MG/3DAYS TD PT72: 1.0000 | MEDICATED_PATCH | Freq: Once | TRANSDERMAL | Status: DC

## 2016-07-20 MED ORDER — ONDANSETRON HCL 4 MG/2ML IJ SOLN: 4.0000 mg | Freq: Three times a day (TID) | INTRAMUSCULAR | Status: DC | PRN

## 2016-07-20 MED ORDER — TETANUS-DIPHTH-ACELL PERTUSSIS 5-2.5-18.5 LF-MCG/0.5 IM SUSP
0.5000 mL | Freq: Once | INTRAMUSCULAR | Status: AC
  Administered 2016-07-21: 0.5 mL via INTRAMUSCULAR
  Filled 2016-07-20: qty 0.5

## 2016-07-20 MED ORDER — FENTANYL 2.5 MCG/ML BUPIVACAINE 1/10 % EPIDURAL INFUSION (WH - ANES)
14.0000 mL/h | INTRAMUSCULAR | Status: DC | PRN
  Administered 2016-07-20: 14 mL/h via EPIDURAL
  Filled 2016-07-20: qty 125

## 2016-07-20 MED ORDER — ZOLPIDEM TARTRATE 5 MG PO TABS: 5.0000 mg | ORAL_TABLET | Freq: Every evening | ORAL | Status: DC | PRN

## 2016-07-20 MED ORDER — EPHEDRINE 5 MG/ML INJ
10.0000 mg | INTRAVENOUS | Status: DC | PRN
  Filled 2016-07-20: qty 4

## 2016-07-20 MED ORDER — OXYTOCIN 10 UNIT/ML IJ SOLN
INTRAMUSCULAR | Status: AC
  Filled 2016-07-20: qty 4

## 2016-07-20 MED ORDER — NALOXONE HCL 2 MG/2ML IJ SOSY
1.0000 ug/kg/h | PREFILLED_SYRINGE | INTRAVENOUS | Status: DC | PRN
  Filled 2016-07-20: qty 2

## 2016-07-20 MED ORDER — LACTATED RINGERS IV SOLN: 500.0000 mL | Freq: Once | INTRAVENOUS | Status: DC

## 2016-07-20 MED ORDER — NALBUPHINE HCL 10 MG/ML IJ SOLN
5.0000 mg | INTRAMUSCULAR | Status: DC | PRN
  Administered 2016-07-20: 5 mg via INTRAVENOUS
  Filled 2016-07-20: qty 1

## 2016-07-20 MED ORDER — LACTATED RINGERS IV SOLN: INTRAVENOUS | Status: DC

## 2016-07-20 MED ORDER — NALBUPHINE HCL 10 MG/ML IJ SOLN: 5.0000 mg | INTRAMUSCULAR | Status: DC | PRN

## 2016-07-20 MED ORDER — PHENYLEPHRINE 40 MCG/ML (10ML) SYRINGE FOR IV PUSH (FOR BLOOD PRESSURE SUPPORT): 80.0000 ug | PREFILLED_SYRINGE | INTRAVENOUS | Status: DC | PRN

## 2016-07-20 MED ORDER — DIBUCAINE 1 % RE OINT: 1.0000 "application " | TOPICAL_OINTMENT | RECTAL | Status: DC | PRN

## 2016-07-20 MED ORDER — CEFAZOLIN SODIUM-DEXTROSE 2-4 GM/100ML-% IV SOLN
INTRAVENOUS | Status: AC
  Filled 2016-07-20: qty 100

## 2016-07-20 MED ORDER — PHENYLEPHRINE 40 MCG/ML (10ML) SYRINGE FOR IV PUSH (FOR BLOOD PRESSURE SUPPORT)
80.0000 ug | PREFILLED_SYRINGE | INTRAVENOUS | Status: DC | PRN
  Administered 2016-07-20: 80 ug via INTRAVENOUS

## 2016-07-20 MED ORDER — ACETAMINOPHEN 325 MG PO TABS
650.0000 mg | ORAL_TABLET | ORAL | Status: DC | PRN
  Administered 2016-07-21 (×2): 650 mg via ORAL
  Filled 2016-07-20 (×2): qty 2

## 2016-07-20 MED ORDER — PHENYLEPHRINE 40 MCG/ML (10ML) SYRINGE FOR IV PUSH (FOR BLOOD PRESSURE SUPPORT)
80.0000 ug | PREFILLED_SYRINGE | INTRAVENOUS | Status: DC | PRN
  Filled 2016-07-20 (×3): qty 10

## 2016-07-20 MED ORDER — ONDANSETRON HCL 4 MG/2ML IJ SOLN
INTRAMUSCULAR | Status: AC
  Filled 2016-07-20: qty 2

## 2016-07-20 MED ORDER — EPHEDRINE 5 MG/ML INJ
10.0000 mg | INTRAVENOUS | Status: DC | PRN
  Administered 2016-07-20: 10 mg via INTRAVENOUS

## 2016-07-20 MED ORDER — CEFAZOLIN SODIUM-DEXTROSE 2-3 GM-% IV SOLR
INTRAVENOUS | Status: DC | PRN
Start: 2016-07-20 — End: 2016-07-20
  Administered 2016-07-20: 2 g via INTRAVENOUS

## 2016-07-20 MED ORDER — OXYTOCIN 10 UNIT/ML IJ SOLN
40.0000 [IU] | INTRAVENOUS | Status: DC | PRN
  Administered 2016-07-20: 40 [IU] via INTRAVENOUS

## 2016-07-20 MED ORDER — FENTANYL CITRATE (PF) 100 MCG/2ML IJ SOLN: 25.0000 ug | INTRAMUSCULAR | Status: DC | PRN

## 2016-07-20 MED ORDER — KETOROLAC TROMETHAMINE 30 MG/ML IJ SOLN
30.0000 mg | Freq: Four times a day (QID) | INTRAMUSCULAR | Status: DC | PRN
  Administered 2016-07-20: 30 mg via INTRAMUSCULAR

## 2016-07-20 MED ORDER — SCOPOLAMINE 1 MG/3DAYS TD PT72
MEDICATED_PATCH | TRANSDERMAL | Status: DC | PRN
  Administered 2016-07-20: 1 via TRANSDERMAL

## 2016-07-20 MED ORDER — DEXAMETHASONE SODIUM PHOSPHATE 10 MG/ML IJ SOLN
INTRAMUSCULAR | Status: DC | PRN
  Administered 2016-07-20: 8 mg via INTRAVENOUS

## 2016-07-20 MED ORDER — WITCH HAZEL-GLYCERIN EX PADS: 1.0000 "application " | MEDICATED_PAD | CUTANEOUS | Status: DC | PRN

## 2016-07-20 MED ORDER — MORPHINE SULFATE (PF) 0.5 MG/ML IJ SOLN
INTRAMUSCULAR | Status: AC
  Filled 2016-07-20: qty 10

## 2016-07-20 MED ORDER — MENTHOL 3 MG MT LOZG: 1.0000 | LOZENGE | OROMUCOSAL | Status: DC | PRN

## 2016-07-20 MED ORDER — MORPHINE SULFATE (PF) 0.5 MG/ML IJ SOLN
INTRAMUSCULAR | Status: DC | PRN
  Administered 2016-07-20: 4 mg via EPIDURAL

## 2016-07-20 MED ORDER — SCOPOLAMINE 1 MG/3DAYS TD PT72
1.0000 | MEDICATED_PATCH | Freq: Once | TRANSDERMAL | Status: DC
  Filled 2016-07-20: qty 1

## 2016-07-20 MED ORDER — OXYCODONE-ACETAMINOPHEN 5-325 MG PO TABS: 2.0000 | ORAL_TABLET | ORAL | Status: DC | PRN

## 2016-07-20 MED ORDER — COCONUT OIL OIL: 1.0000 "application " | TOPICAL_OIL | Status: DC | PRN

## 2016-07-20 MED ORDER — SCOPOLAMINE 1 MG/3DAYS TD PT72
MEDICATED_PATCH | TRANSDERMAL | Status: AC
  Filled 2016-07-20: qty 1

## 2016-07-20 MED ORDER — SIMETHICONE 80 MG PO CHEW
80.0000 mg | CHEWABLE_TABLET | Freq: Three times a day (TID) | ORAL | Status: DC
  Administered 2016-07-21 – 2016-07-22 (×4): 80 mg via ORAL
  Filled 2016-07-20 (×4): qty 1

## 2016-07-20 MED ORDER — SIMETHICONE 80 MG PO CHEW
80.0000 mg | CHEWABLE_TABLET | ORAL | Status: DC
  Administered 2016-07-20 – 2016-07-21 (×2): 80 mg via ORAL
  Filled 2016-07-20 (×2): qty 1

## 2016-07-20 MED ORDER — DIPHENHYDRAMINE HCL 25 MG PO CAPS: 25.0000 mg | ORAL_CAPSULE | Freq: Four times a day (QID) | ORAL | Status: DC | PRN

## 2016-07-20 MED ORDER — LACTATED RINGERS IV SOLN: 125.0000 mL/h | INTRAVENOUS | Status: DC

## 2016-07-20 MED ORDER — KETOROLAC TROMETHAMINE 30 MG/ML IJ SOLN
INTRAMUSCULAR | Status: AC
  Filled 2016-07-20: qty 1

## 2016-07-20 MED ORDER — ONDANSETRON HCL 4 MG/2ML IJ SOLN
INTRAMUSCULAR | Status: DC | PRN
  Administered 2016-07-20: 4 mg via INTRAVENOUS

## 2016-07-20 MED ORDER — OXYTOCIN 40 UNITS IN LACTATED RINGERS INFUSION - SIMPLE MED
1.0000 m[IU]/min | INTRAVENOUS | Status: DC
  Administered 2016-07-20: 2 m[IU]/min via INTRAVENOUS
  Filled 2016-07-20: qty 1000

## 2016-07-20 MED ORDER — DIPHENHYDRAMINE HCL 25 MG PO CAPS: 25.0000 mg | ORAL_CAPSULE | ORAL | Status: DC | PRN

## 2016-07-20 MED ORDER — IBUPROFEN 600 MG PO TABS
600.0000 mg | ORAL_TABLET | Freq: Four times a day (QID) | ORAL | Status: DC
  Administered 2016-07-20 – 2016-07-22 (×7): 600 mg via ORAL
  Filled 2016-07-20 (×7): qty 1

## 2016-07-20 MED ORDER — PRENATAL MULTIVITAMIN CH
1.0000 | ORAL_TABLET | Freq: Every day | ORAL | Status: DC
  Administered 2016-07-21 – 2016-07-22 (×2): 1 via ORAL
  Filled 2016-07-20 (×2): qty 1

## 2016-07-20 MED ORDER — TERBUTALINE SULFATE 1 MG/ML IJ SOLN: 0.2500 mg | Freq: Once | INTRAMUSCULAR | Status: DC | PRN

## 2016-07-20 MED ORDER — LACTATED RINGERS IV SOLN
INTRAVENOUS | Status: DC | PRN
  Administered 2016-07-20: 16:00:00 via INTRAVENOUS

## 2016-07-20 MED ORDER — KETOROLAC TROMETHAMINE 30 MG/ML IJ SOLN: 30.0000 mg | Freq: Four times a day (QID) | INTRAMUSCULAR | Status: DC | PRN

## 2016-07-20 MED ORDER — LACTATED RINGERS IV SOLN
500.0000 mL | Freq: Once | INTRAVENOUS | Status: AC
  Administered 2016-07-20 (×2): via INTRAVENOUS

## 2016-07-20 MED ORDER — LIDOCAINE HCL (PF) 1 % IJ SOLN
INTRAMUSCULAR | Status: DC | PRN
  Administered 2016-07-20 (×2): 4 mL via EPIDURAL

## 2016-07-20 SURGICAL SUPPLY — 31 items
BENZOIN TINCTURE PRP APPL 2/3 (GAUZE/BANDAGES/DRESSINGS) ×3 IMPLANT
CHLORAPREP W/TINT 26ML (MISCELLANEOUS) ×3 IMPLANT
CLAMP CORD UMBIL (MISCELLANEOUS) ×3 IMPLANT
CLOSURE WOUND 1/2 X4 (GAUZE/BANDAGES/DRESSINGS) ×1
CLOTH BEACON ORANGE TIMEOUT ST (SAFETY) ×3 IMPLANT
DRSG OPSITE POSTOP 4X10 (GAUZE/BANDAGES/DRESSINGS) ×3 IMPLANT
DRSG PAD ABDOMINAL 8X10 ST (GAUZE/BANDAGES/DRESSINGS) ×3 IMPLANT
ELECT REM PT RETURN 9FT ADLT (ELECTROSURGICAL) ×3
ELECTRODE REM PT RTRN 9FT ADLT (ELECTROSURGICAL) ×1 IMPLANT
GLOVE BIOGEL PI IND STRL 7.0 (GLOVE) ×1 IMPLANT
GLOVE BIOGEL PI IND STRL 7.5 (GLOVE) ×2 IMPLANT
GLOVE BIOGEL PI INDICATOR 7.0 (GLOVE) ×2
GLOVE BIOGEL PI INDICATOR 7.5 (GLOVE) ×4
GLOVE ECLIPSE 7.5 STRL STRAW (GLOVE) ×3 IMPLANT
GOWN STRL REUS W/TWL LRG LVL3 (GOWN DISPOSABLE) ×9 IMPLANT
NS IRRIG 1000ML POUR BTL (IV SOLUTION) ×3 IMPLANT
PACK C SECTION WH (CUSTOM PROCEDURE TRAY) ×3 IMPLANT
PAD OB MATERNITY 4.3X12.25 (PERSONAL CARE ITEMS) ×3 IMPLANT
PENCIL SMOKE EVAC W/HOLSTER (ELECTROSURGICAL) ×3 IMPLANT
RTRCTR C-SECT PINK 25CM LRG (MISCELLANEOUS) ×3 IMPLANT
SPONGE GAUZE 4X4 12PLY STER LF (GAUZE/BANDAGES/DRESSINGS) ×6 IMPLANT
STRIP CLOSURE SKIN 1/2X4 (GAUZE/BANDAGES/DRESSINGS) ×2 IMPLANT
SUT PLAIN 2 0 XLH (SUTURE) ×3 IMPLANT
SUT VIC AB 0 CTX 36 (SUTURE) ×8
SUT VIC AB 0 CTX36XBRD ANBCTRL (SUTURE) ×4 IMPLANT
SUT VIC AB 2-0 CT1 27 (SUTURE) ×2
SUT VIC AB 2-0 CT1 TAPERPNT 27 (SUTURE) ×1 IMPLANT
SUT VIC AB 4-0 KS 27 (SUTURE) ×3 IMPLANT
TAPE CLOTH SURG 4X10 WHT LF (GAUZE/BANDAGES/DRESSINGS) ×3 IMPLANT
TOWEL OR 17X24 6PK STRL BLUE (TOWEL DISPOSABLE) ×3 IMPLANT
TRAY FOLEY CATH SILVER 14FR (SET/KITS/TRAYS/PACK) ×3 IMPLANT

## 2016-07-20 NOTE — Anesthesia Preprocedure Evaluation (Signed)
Anesthesia Evaluation  Patient identified by MRN, date of birth, ID band Patient awake    Reviewed: Allergy & Precautions, NPO status , Patient's Chart, lab work & pertinent test results  Airway Mallampati: II  TM Distance: >3 FB Neck ROM: Full    Dental  (+) Teeth Intact, Dental Advisory Given   Pulmonary neg pulmonary ROS,    breath sounds clear to auscultation       Cardiovascular hypertension,  Rhythm:Regular Rate:Normal     Neuro/Psych negative neurological ROS  negative psych ROS   GI/Hepatic negative GI ROS, Neg liver ROS,   Endo/Other  negative endocrine ROS  Renal/GU   negative genitourinary   Musculoskeletal negative musculoskeletal ROS (+)   Abdominal   Peds negative pediatric ROS (+)  Hematology negative hematology ROS (+)   Anesthesia Other Findings   Reproductive/Obstetrics (+) Pregnancy                             Lab Results  Component Value Date   WBC 10.5 07/19/2016   HGB 13.8 07/19/2016   HCT 39.2 07/19/2016   MCV 86.2 07/19/2016   PLT 230 07/19/2016   No results found for: INR, PROTIME   Anesthesia Physical Anesthesia Plan  ASA: III  Anesthesia Plan: Epidural   Post-op Pain Management:    Induction:   Airway Management Planned:   Additional Equipment:   Intra-op Plan:   Post-operative Plan:   Informed Consent: I have reviewed the patients History and Physical, chart, labs and discussed the procedure including the risks, benefits and alternatives for the proposed anesthesia with the patient or authorized representative who has indicated his/her understanding and acceptance.     Plan Discussed with:   Anesthesia Plan Comments:         Anesthesia Quick Evaluation

## 2016-07-20 NOTE — Progress Notes (Signed)
Patient ID: Carolyn Hanson, female   DOB: 1981/09/01, 35 y.o.   MRN: [id number]  Patient having recurrent lates with every contraction.  Unable to start pitocin.  Ctxs every 8 minutes.  Also had third prolonged deceleration.  The risks of cesarean section discussed with the patient included but were not limited to: bleeding which may require transfusion or reoperation; infection which may require antibiotics; injury to bowel, bladder, ureters or other surrounding organs; injury to the fetus; need for additional procedures including hysterectomy in the event of a life-threatening hemorrhage; placental abnormalities wth subsequent pregnancies, incisional problems, thromboembolic phenomenon and other postoperative/anesthesia complications. The patient concurred with the proposed plan, giving informed written consent for the procedure.   Patient has been NPO since this AM - she will remain NPO for procedure. Anesthesia and OR aware.  Preoperative prophylactic Ancef ordered on call to the OR.  To OR when ready.  Levie Heritage, DO 07/20/2016 3:18 PM

## 2016-07-20 NOTE — Op Note (Signed)
Cesarean Section Operative Report  Ciel Boeckmann  07/19/2016 - 07/20/2016  Indications: Failed VBAC and Fetal intolerance to labor   Pre-operative Diagnosis: repeat cesarean section for fetal intolerance to labor.   Post-operative Diagnosis: Same   Surgeon: Surgeon(s) and Role:    * Levie Heritage, DO - Primary    * Jen Mow, DO - Assistant  Attending Attestation: I was present and scrubbed for the entire procedure.   Assistants: Jen Mow, DO  Anesthesia: epidural    Estimated Blood Loss: 500 ml  Total IV Fluids: 2700 ml LR  Urine Output:: 200 ml clear urine  Specimens: Placenta  Findings: Viable female infant in cephalic presentation; Apgars 9/9; weight pending; arterial cord pH - not sent; light meconium amniotic fluid; intact placenta with three vessel cord; normal uterus, fallopian tubes and ovaries bilaterally.  Baby condition / location:  Couplet care / Skin to Skin   Complications: no complications  Indications: Maribeth Beltz is a 35 y.o. G2P1001 with an IUP [redacted]w[redacted]d presenting with PROM and TOLAC, resulting in failed TOLAC due to fetal intolerance to labor.  The risks, benefits, complications, treatment options, and exected outcomes were discussed with the patient . The patient dwith the proposed plan, giving informed consent. identified as Nicoletta Ba and the procedure verified as C-Section Delivery.  Procedure Details:  The patient was taken back to the operative suite where epidural anesthesia bolus was placed.  A time out was held and the above information confirmed.   After induction of anesthesia, the patient was draped and prepped in the usual sterile manner and placed in a dorsal supine position with a leftward tilt. A Pfannenstiel incision was made and carried down through the subcutaneous tissue to the fascia. Fascial incision was made and extended transversely. The fascia was separated from the underlying  rectus tissue superiorly and inferiorly. The peritoneum was identified and entered and extended longitudinally. Alexis retractor was placed. A low transverse uterine incision was made and extended bluntly. Delivered from cephalic presentation was a viable infant with Apgars and weight as above.  After waiting 60 seconds for delayed cord cutting, the umbilical cord was clamped and cut cord blood was obtained for evaluation. Cord ph was not sent. The placenta was removed Intact and appeared normal. The uterine outline, tubes and ovaries appeared normal. The uterine incision was closed with running locked sutures of 0Vicryl, with repair of an extension to the right uterine incision with 0 Vicryl, and with an imbricating layer of the same.   Hemostasis was observed. The peritoneum was closed with 2-0 Vicryl. The rectus muscles were examined and hemostasis observed. The fascia was then reapproximated with running sutures of 0Vicryl. The subcuticular closure was not needed. The skin was closed with 4-0Vicryl.   Instrument, sponge, and needle counts were correct prior the abdominal closure and were correct at the conclusion of the case.     Disposition: PACU - hemodynamically stable.   Maternal Condition: stable       SignedJustice Deeds 07/20/2016 5:00 PM

## 2016-07-20 NOTE — Consult Note (Signed)
[facility]  Delivery Note:  C-section       07/20/2016  3:55 PM  I was called to the operating room at the request of the patient's obstetrician (Dr. Stinson) for a repeat c-section after fetal intolerance of labor during a TOLAC.  PRENATAL HX:  This is a 35 y/o G2P1001 at 38 and 3/[redacted] weeks gestation who was admitted yesterday for PPROM at 2200 (~18 hours).  The fetus began to have non-reassuring heart tones after a TOLAC, so infant delivered by c-section.  Terminal meconium noted.  Her pregnancy is complicated by fetal left multicystic dysplastic kidney with a normal appearing right kidney.   DELIVERY:  Infant was vigorous at delivery, requiring no resuscitation other than standard warming, drying and stimulation.  APGARs 9 and 9.  Exam within normal limits.  After 5 minutes, baby left with nurse to assist parents with skin-to-skin care.  Given prenatal findings, recommend post-natal renal ultrasound.    _____________________ Electronically Signed By: Sharita Bienaime, MD Neonatologist  

## 2016-07-20 NOTE — Progress Notes (Signed)
Carolyn Hanson Carlynn Purl is a 35 y.o. G2P1001 at [redacted]w[redacted]d by LMP admitted for rupture of membranes and labor.  Subjective: Patient seen & evaluated for variable decelerations. Interpretor in the room. Patient with oxygen mask on and laying right decubitus position. Epidural in place and patient is not feeling contractions.   Objective: BP 110/60 mmHg  Pulse 78  Temp(Src) 98.4 F (36.9 C) (Oral)  Resp 20  Ht 4\' 11"  (1.499 m)  Wt 189 lb (85.73 kg)  BMI 38.15 kg/m2  SpO2 100%  LMP 10/25/2015 (Exact Date)   Total I/O In: -  Out: 100 [Urine:100]  FHT:  FHR: 140 bpm, variability: moderate,  accelerations:  Present,  decelerations:  Present variables to 90, <1 minute UC:   regular, every 3-4 minutes; Pitocin at 6 SVE:   Dilation: 5 Effacement (%): 80 Station: -2 Exam by:: Valentina Lucks, RN  Bulging bag present  AROM performed under epidural anesthesia, thin meconium fluid returned. No change in SVE after AROM. IUPC placed.   About 15 minutes after AROM, patient had a prolonged variable deceleration nadir to 90 for about 90 seconds, with return to baseline. Amnioinfusion started.   Variable decels persisted, nadirs to 90 with recovery to baseline, pitocin stopped at about 12:45PM.   Labs: Lab Results  Component Value Date   WBC 10.5 07/19/2016   HGB 13.8 07/19/2016   HCT 39.2 07/19/2016   MCV 86.2 07/19/2016   PLT 230 07/19/2016    Assessment / Plan: Augmentation of labor  Labor: Not progressing, with some fetal intolerance. Pitocin stopped, oxygen mask on patient, amnioinfusion continued. Preeclampsia:  no signs or symptoms of toxicity Fetal Wellbeing:  Category II Pain Control:  Epidural I/D:  n/a Anticipated MOD:  NSVD  Jen Mow 07/20/2016, 12:40 PM

## 2016-07-20 NOTE — Anesthesia Procedure Notes (Signed)
Epidural Patient location during procedure: OB Start time: 07/20/2016 10:06 AM End time: 07/20/2016 10:14 AM  Staffing Anesthesiologist: Shona Simpson D Performed by: anesthesiologist   Preanesthetic Checklist Completed: patient identified, site marked, surgical consent, pre-op evaluation, timeout performed, IV checked, risks and benefits discussed and monitors and equipment checked  Epidural Patient position: sitting Prep: ChloraPrep Patient monitoring: heart rate, continuous pulse ox and blood pressure Approach: midline Location: L3-L4 Injection technique: LOR saline  Needle:  Needle type: Tuohy  Needle gauge: 17 G Needle length: 9 cm Catheter type: closed end flexible Catheter size: 20 Guage Test dose: negative and 1.5% lidocaine  Assessment Events: blood not aspirated, injection not painful, no injection resistance and no paresthesia  Additional Notes LOR @ 6.5  Patient identified. Risks/Benefits/Options discussed with patient including but not limited to bleeding, infection, nerve damage, paralysis, failed block, incomplete pain control, headache, blood pressure changes, nausea, vomiting, reactions to medications, itching and postpartum back pain. Confirmed with bedside nurse the patient's most recent platelet count. Confirmed with patient that they are not currently taking any anticoagulation, have any bleeding history or any family history of bleeding disorders. Patient expressed understanding and wished to proceed. All questions were answered. Sterile technique was used throughout the entire procedure. Please see nursing notes for vital signs. Test dose was given through epidural catheter and negative prior to continuing to dose epidural or start infusion. Warning signs of high block given to the patient including shortness of breath, tingling/numbness in hands, complete motor block, or any concerning symptoms with instructions to call for help. Patient was given instructions  on fall risk and not to get out of bed. All questions and concerns addressed with instructions to call with any issues or inadequate analgesia.    Reason for block:procedure for pain

## 2016-07-20 NOTE — Anesthesia Pain Management Evaluation Note (Signed)
  CRNA Pain Management Visit Note  Patient: Carolyn Hanson, 35 y.o., female  "Hello I am a member of the anesthesia team at [facility]. We have an anesthesia team available at all times to provide care throughout the hospital, including epidural management and anesthesia for C-section. I don't know your plan for the delivery whether it a natural birth, water birth, IV sedation, nitrous supplementation, doula or epidural, but we want to meet your pain goals."   1.Was your pain managed to your expectations on prior hospitalizations?   Yes   2.What is your expectation for pain management during this hospitalization?     IV pain meds  3.How can we help you reach that goal? Unsure  Record the patient's initial score and the patient's pain goal.   Pain: 9  Pain Goal: 10 [facility] wants you to be able to say your pain was always managed very well.  Cephus Shelling 07/20/2016

## 2016-07-20 NOTE — Transfer of Care (Signed)
Immediate Anesthesia Transfer of Care Note  Patient: Carolyn Hanson  Procedure(s) Performed: Procedure(s): CESAREAN SECTION (N/A)  Patient Location: PACU  Anesthesia Type:Spinal  Level of Consciousness: awake, alert  and oriented  Airway & Oxygen Therapy: Patient Spontanous Breathing  Post-op Assessment: Report given to RN and Post -op Vital signs reviewed and stable  Post vital signs: Reviewed and stable  Last Vitals:  Filed Vitals:   07/20/16 1524 07/20/16 1526  BP: 111/74   Pulse: 131   Temp:    Resp: 18 20    Last Pain:  Filed Vitals:   07/20/16 1541  PainSc: 0-No pain      Patients Stated Pain Goal: 10 (07/20/16 0923)  Complications: No apparent anesthesia complications

## 2016-07-20 NOTE — Progress Notes (Signed)
Patient ID: Carolyn Hanson, female   DOB: [birth year], 35 y.o.   MRN: [id number] Carolyn Hanson is a 35 y.o. G2P1001 at [redacted]w[redacted]d admitted for rupture of membranes  Subjective: Uncomfortable w/ uc's, requesting pain meds, not sure about epidural  Objective: BP 120/70 mmHg  Pulse 88  Temp(Src) 98.1 F (36.7 C) (Oral)  Resp 18  Ht 4\' 11"  (1.499 m)  Wt 85.73 kg (189 lb)  BMI 38.15 kg/m2  LMP 10/25/2015 (Exact Date)    FHT:  FHR: 155 bpm, variability: moderate,  accelerations:  Present,  decelerations:  Present earlies/occ variable UC:   q 1-38min  SVE:   Dilation: 4 Effacement (%): 70 Station: -2 Exam by:: Valentina Lucks, RN   Vertical skin incision, states she is unaware of what type uterine incision- per problem list notes/has been discussed w/ Dr. Macon Large in clinic who feels confident uterine incision is transverse d/t baby being born at term- c/s was for elevated bp/no labor  Pitocin @ 6 mu/min  Labs: Lab Results  Component Value Date   WBC 10.5 07/19/2016   HGB 13.8 07/19/2016   HCT 39.2 07/19/2016   MCV 86.2 07/19/2016   PLT 230 07/19/2016    Assessment / Plan: TOLAC, admitted for PROM 7/21 @ 2200, s/p cervical foley bulb which fell out @ 0800 and pitocin was started  Labor: Progressing normally Fetal Wellbeing:  Category II Pain Control:  IV pain meds and after talking w/ spanish interpreter, decided she wants to proceed w/ epidural Pre-eclampsia: n/a I/D:  n/a Anticipated MOD:  VBAC  Marge Duncans CNM, WHNP-BC 07/20/2016, 0930

## 2016-07-20 NOTE — Anesthesia Postprocedure Evaluation (Signed)
Anesthesia Post Note  Patient: Carolyn Hanson  Procedure(s) Performed: Procedure(s) (LRB): CESAREAN SECTION (N/A)  Patient location during evaluation: PACU Anesthesia Type: Epidural Level of consciousness: oriented and awake and alert Pain management: pain level controlled Vital Signs Assessment: post-procedure vital signs reviewed and stable Respiratory status: spontaneous breathing, respiratory function stable and patient connected to nasal cannula oxygen Cardiovascular status: blood pressure returned to baseline and stable Postop Assessment: no headache, no backache and epidural receding Anesthetic complications: no     Last Vitals:  Filed Vitals:   07/20/16 1804 07/20/16 1815  BP:  114/62  Pulse: 74 57  Temp:  37.1 C  Resp: 14 16    Last Pain:  Filed Vitals:   07/20/16 1823  PainSc: 0-No pain   Pain Goal: Patients Stated Pain Goal: 10 (07/20/16 0923)               Shelton Silvas

## 2016-07-21 LAB — CBC
HCT: 32.9 % — ABNORMAL LOW (ref 36.0–46.0)
Hemoglobin: 10.9 g/dL — ABNORMAL LOW (ref 12.0–15.0)
MCH: 30 pg (ref 26.0–34.0)
MCHC: 33.4 g/dL (ref 30.0–36.0)
MCV: 89.6 fL (ref 78.0–100.0)
PLATELETS: 166 10*3/uL (ref 150–400)
RBC: 3.67 MIL/uL — ABNORMAL LOW (ref 3.87–5.11)
RDW: 13.5 % (ref 11.5–15.5)
WBC: 19.3 10*3/uL — AB (ref 4.0–10.5)

## 2016-07-21 NOTE — Progress Notes (Addendum)
Pt O2 sat dropping below 90% when falling asleep.  Dr. Chanetta Marshall called and gave order to give O2 via nasal cannula.  O2 started at 2L.

## 2016-07-21 NOTE — Addendum Note (Signed)
Addendum  created 07/21/16 0907 by Rica Records, CRNA   Sign clinical note

## 2016-07-21 NOTE — Progress Notes (Signed)
Subjective: Postpartum Day 1: Cesarean Delivery Eating, drinking well.  Foley still in. Slightly dizzy w/ standing/walking to br earlier this am. +flatus.  Lochia and pain wnl.  No complaints.   Objective: Vital signs in last 24 hours: Temp:  [97.8 F (36.6 C)-98.7 F (37.1 C)] 97.8 F (36.6 C) (07/23 0600) Pulse Rate:  [56-131] 61 (07/22 2130) Resp:  [14-20] 18 (07/23 0600) BP: (90-127)/(47-101) 99/67 (07/22 2130) SpO2:  [88 %-100 %] 97 % (07/23 0600)  Physical Exam:  General: alert, cooperative and no distress, eating omelet Lochia: appropriate Uterine Fundus: firm Incision: healing well, no significant drainage, no dehiscence, no significant erythema DVT Evaluation: No evidence of DVT seen on physical exam. Negative Homan's sign. No cords or calf tenderness. No significant calf/ankle edema.   Recent Labs  07/19/16 2320 07/21/16 0530  HGB 13.8 10.9*  HCT 39.2 32.9*    Assessment/Plan: Status post Cesarean section. Doing well postoperatively.  Continue current care. Possible d/c tomorrow Take foley out when ambulating well Br/bottlefeeding Plans IUD  Marge Duncans 07/21/2016, 8:59 AM

## 2016-07-21 NOTE — Lactation Note (Signed)
This note was copied from a baby's chart. Lactation Consultation Note  Patient Name: Girl Vana Karpen [id number] Date: 07/21/2016 Reason for consult: Initial assessment Family member present to interpret for Mom. Mom experienced BF and denies any questions/concerns. Basic teaching reviewed, encouraged to continue to BF with feeding ques. Mom denies any discomfort with BF. Lactation brochure left for review, advised of OP services and support group. Encouraged to call for assist as needed.   Maternal Data Has patient been taught Hand Expression?: Yes Does the patient have breastfeeding experience prior to this delivery?: Yes  Feeding Feeding Type: Breast Fed Length of feed: 45 min (off and on)  LATCH Score/Interventions                      Lactation Tools Discussed/Used     Consult Status Consult Status: Follow-up Date: 07/22/16 Follow-up type: In-patient    Alfred Levins 07/21/2016, 11:31 PM

## 2016-07-21 NOTE — Anesthesia Postprocedure Evaluation (Signed)
Anesthesia Post Note  Patient: Carolyn Hanson  Procedure(s) Performed: Procedure(s) (LRB): CESAREAN SECTION (N/A)  Patient location during evaluation: Mother Baby Anesthesia Type: Epidural Level of consciousness: awake and alert Pain management: pain level controlled Vital Signs Assessment: post-procedure vital signs reviewed and stable Respiratory status: spontaneous breathing, nonlabored ventilation and respiratory function stable Cardiovascular status: stable Postop Assessment: no headache, no backache, epidural receding and patient able to bend at knees Anesthetic complications: no     Last Vitals:  Vitals:   07/21/16 0230 07/21/16 0600  BP:    Pulse:    Resp: 18 18  Temp: 36.7 C 36.6 C    Last Pain:  Vitals:   07/21/16 0700  TempSrc:   PainSc: 0-No pain   Pain Goal: Patients Stated Pain Goal: 5 (07/20/16 1920)               Rica Records

## 2016-07-21 NOTE — Progress Notes (Signed)
Interpreter form signed by family member, form in patient chart

## 2016-07-22 ENCOUNTER — Other Ambulatory Visit: Payer: Medicaid Other

## 2016-07-22 LAB — GC/CHLAMYDIA PROBE AMP (~~LOC~~) NOT AT ARMC
Chlamydia: NEGATIVE
Neisseria Gonorrhea: NEGATIVE

## 2016-07-22 MED ORDER — FERROUS SULFATE 325 (65 FE) MG PO TABS: 325.0000 mg | ORAL_TABLET | Freq: Three times a day (TID) | ORAL | Status: DC

## 2016-07-22 MED ORDER — OXYCODONE-ACETAMINOPHEN 5-325 MG PO TABS: 1.0000 | ORAL_TABLET | ORAL | 0 refills | Status: AC | PRN

## 2016-07-22 MED ORDER — FERROUS FUMARATE 150 MG PO TABS: 1.0000 | ORAL_TABLET | Freq: Every day | ORAL | 0 refills | Status: AC

## 2016-07-22 NOTE — Lactation Note (Signed)
This note was copied from a baby's chart. Lactation Consultation Note Breast feeding going well, mom denies questions/concerns. Hand expression reviewed. Breast care/engorgemetn care reviewed. Mom spoken to through her niece as interpreter.  Patient Name: Carolyn Hanson [id number] Date: 07/22/2016 Reason for consult: Follow-up assessment   Maternal Data    Feeding    LATCH Score/Interventions Latch: Grasps breast easily, tongue down, lips flanged, rhythmical sucking.  Audible Swallowing: Spontaneous and intermittent  Type of Nipple: Everted at rest and after stimulation  Comfort (Breast/Nipple): Soft / non-tender     Hold (Positioning): No assistance needed to correctly position infant at breast. Intervention(s): Breastfeeding basics reviewed;Support Pillows;Position options  LATCH Score: 10  Lactation Tools Discussed/Used     Consult Status Consult Status: Complete Follow-up type: Call as needed    Alfred Levins 07/22/2016, 8:55 AM

## 2016-07-22 NOTE — Discharge Summary (Signed)
Obstetric Discharge Summary Reason for Admission: rupture of membranes Prenatal Procedures: none Intrapartum Procedures: cesarean: low cervical, transverse Postpartum Procedures: none Complications-Operative and Postpartum: none Hemoglobin  Date Value Ref Range Status  07/21/2016 10.9 (L) 12.0 - 15.0 g/dL Final    Comment:    DELTA CHECK NOTED REPEATED TO VERIFY    HCT  Date Value Ref Range Status  07/21/2016 32.9 (L) 36.0 - 46.0 % Final    Physical Exam:  General: alert, cooperative and no distress Lochia: appropriate Uterine Fundus: firm Incision: healing well, no significant drainage, no significant erythema DVT Evaluation: No evidence of DVT seen on physical exam.  Discharge Diagnoses: Spontaneous Rupture of Membranes  Discharge Information: Date: 07/22/2016 Activity: pelvic rest Diet: routine Medications: Ibuprofen and Percocet Condition: stable Instructions: refer to practice specific booklet Discharge to: home   Newborn Data: Live born female  Birth Weight: 6 lb 5.2 oz (2869 g) APGAR: 9, 9  Home with mother.  Andres Ege, MD, PGY-1, MPH 07/22/2016, 3:40 PM   CNM attestation I have seen and examined this patient and agree with above documentation in the resident's note.   Carolyn Hanson Carlynn Purl is a 35 y.o. G2P2002 s/p rLTCS.   Pain is well controlled.  Plan for birth control is IUD.  Method of Feeding: breast  PE:  BP 108/68 (BP Location: Right Arm)   Pulse 73   Temp 97.9 F (36.6 C) (Oral)   Resp 20   Ht 4\' 11"  (1.499 m)   Wt 85.7 kg (189 lb)   LMP 10/25/2015 (Exact Date)   SpO2 96%   Breastfeeding? Unknown   BMI 38.17 kg/m  Fundus firm   Recent Labs  07/21/16 0530  HGB 10.9*  HCT 32.9*     Plan: discharge today - postpartum care discussed - f/u clinic in 6 weeks for postpartum visit   SHAW, KIMBERLY, CNM 9:28 AM

## 2016-07-22 NOTE — Progress Notes (Signed)
Patient wishes her family to order her meals.  Carolyn Hanson  Interpreter.

## 2016-07-23 ENCOUNTER — Encounter (HOSPITAL_COMMUNITY): Payer: Self-pay | Admitting: Family Medicine

## 2016-08-15 ENCOUNTER — Encounter: Payer: Self-pay | Admitting: *Deleted

## 2016-09-16 ENCOUNTER — Ambulatory Visit: Payer: Medicaid Other | Admitting: Family Medicine

## 2016-10-23 IMAGING — US US MFM OB COMP +14 WKS
1 series · 14 of 28 positions shown · non-contrast
Comparison: none

[Series 1: us mfm ob comp +14 wks · 61 acquisitions, 14 frames shown]
[im 3/61]
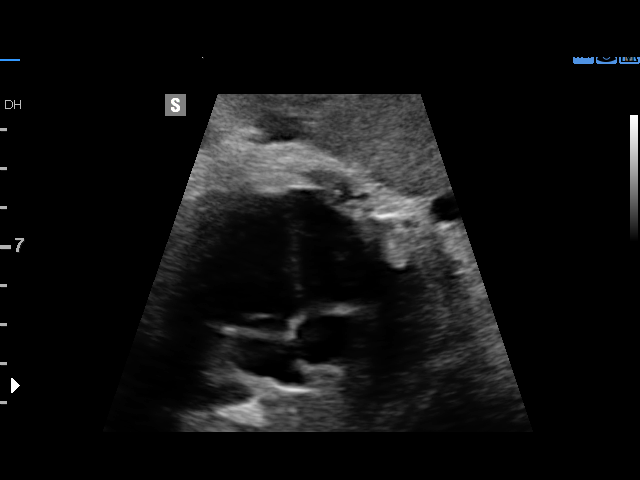
[im 7/61]
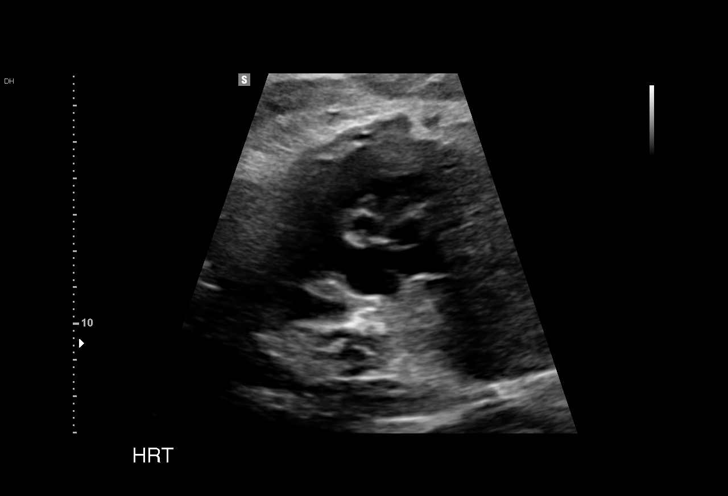
[im 12/61]
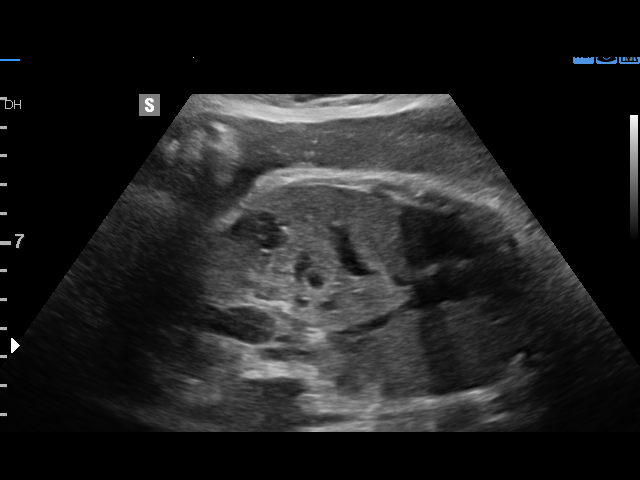
[im 16/61]
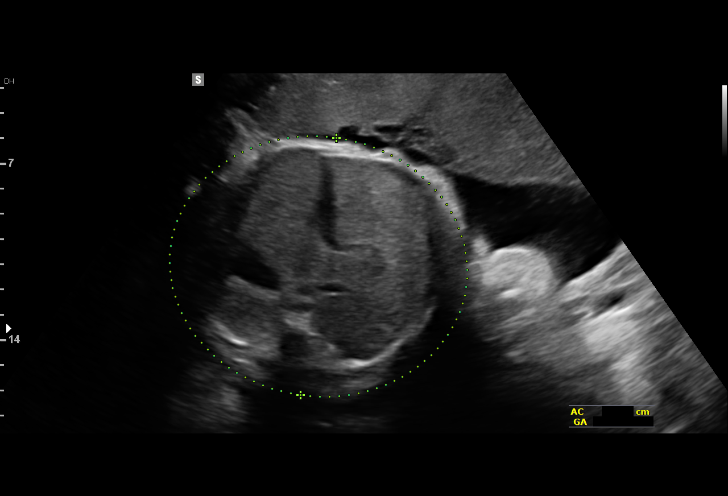
[im 21/61]
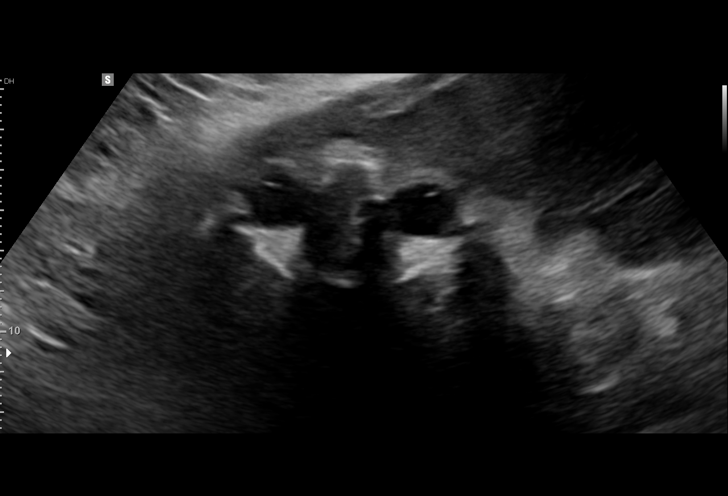
[im 25/61]
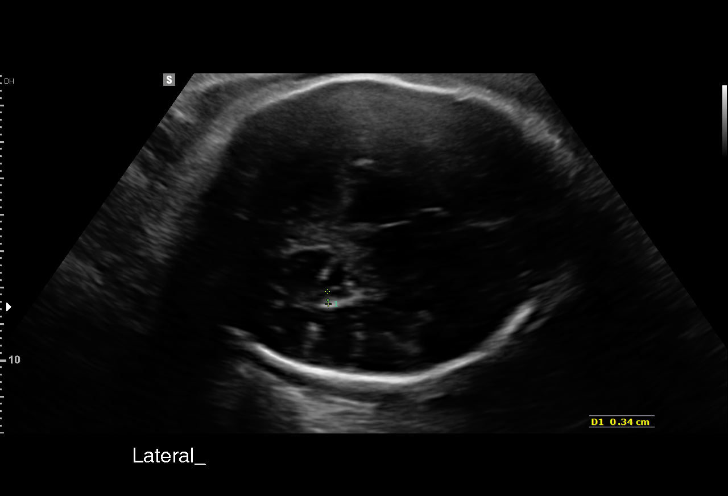
[im 29/61]
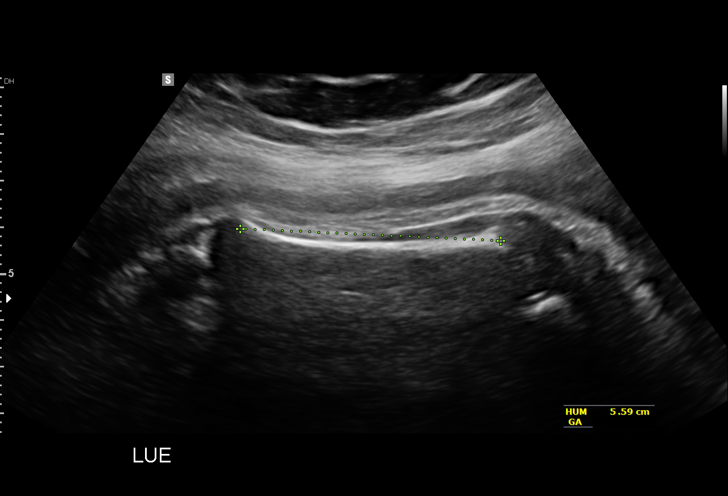
[im 34/61]
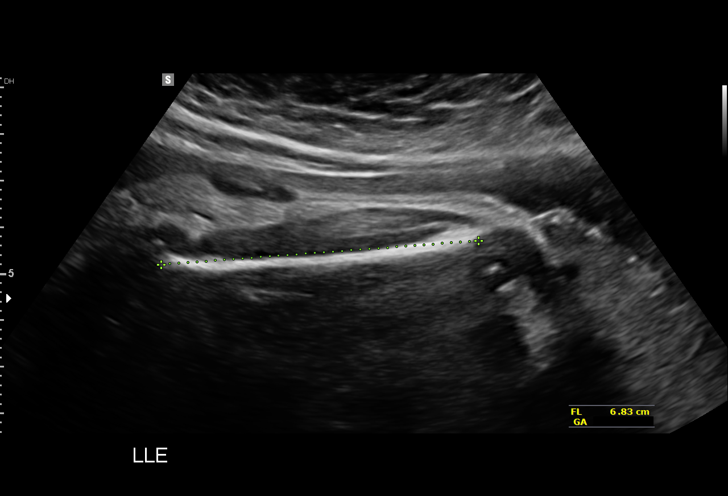
[im 38/61]
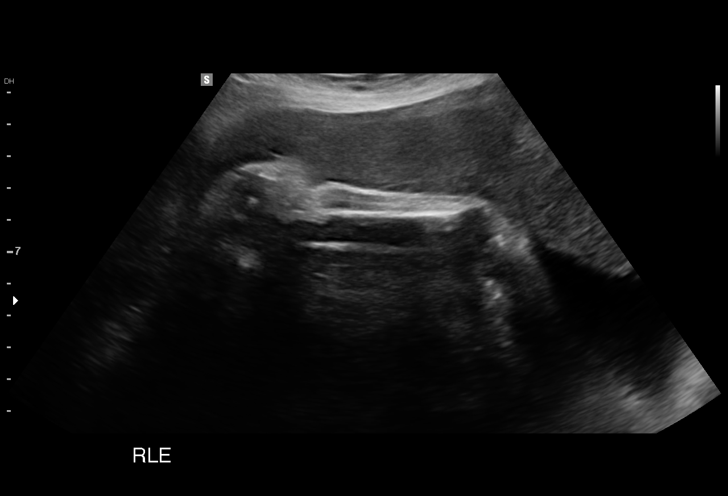
[im 43/61]
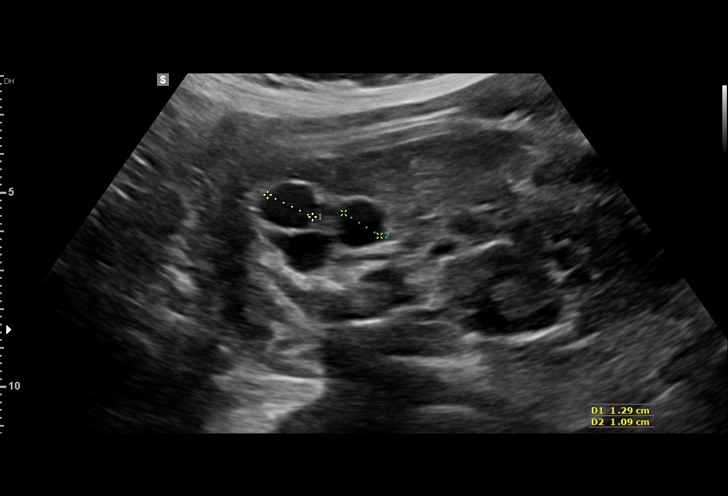
[im 47/61]
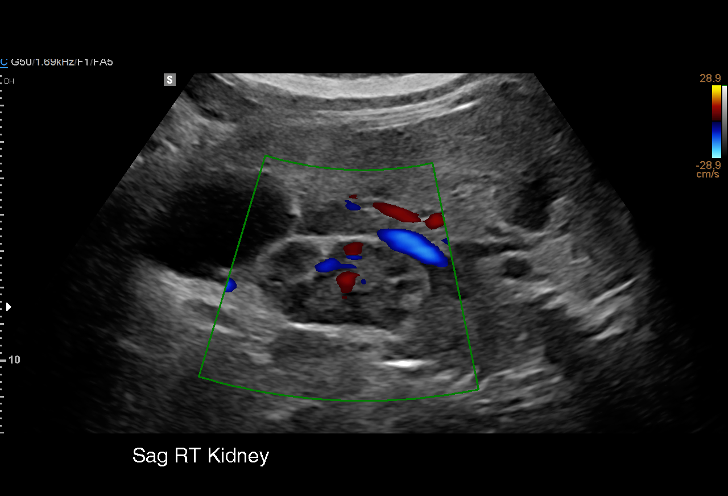
[im 52/61]
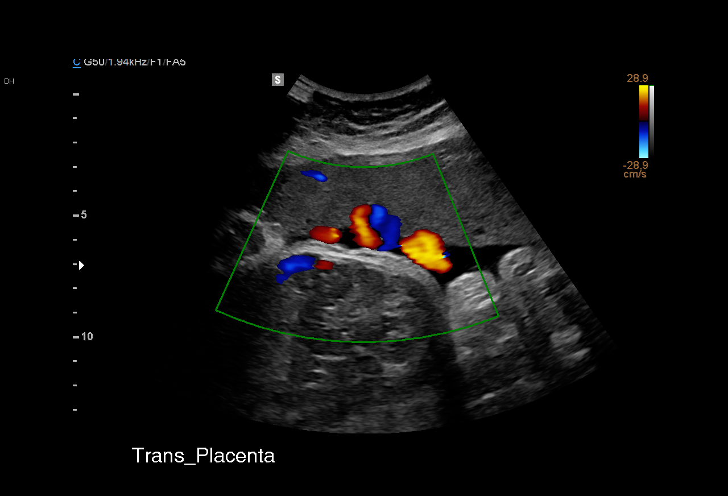
[im 56/61]
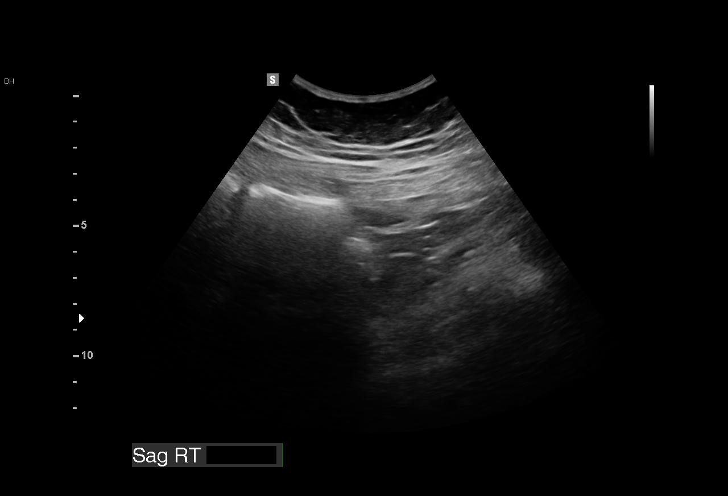
[im 61/61]
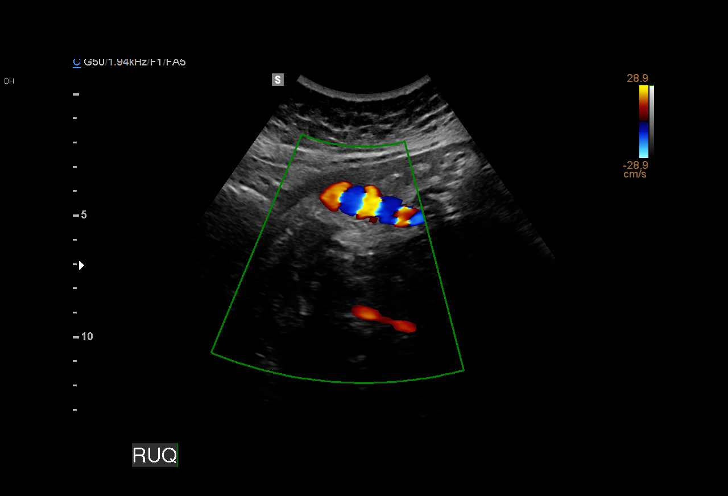

[14 of 28 positions shown; findings below may reference images not displayed]

[facility]

Morayma Stjohn
Faculty Physician
[facility]

Indications

37 weeks gestation of pregnancy
Basic anatomic survey                          Z36
Previous cesarean delivery, antepartum
OB History

Gravidity:    2         Term:   1        Prem:   0        SAB:   0
TOP:          0       Ectopic:  0        Living: 1
Fetal Evaluation

Num Of Fetuses:     1
Cardiac Activity:   Observed
Presentation:       Cephalic
Placenta:           Anterior, above cervical os
P. Cord Insertion:  Visualized, central

Amniotic Fluid
AFI FV:      Subjectively within normal limits

AFI Sum(cm)     %Tile       Largest Pocket(cm)
11.43           35
RUQ(cm)       RLQ(cm)       LUQ(cm)        LLQ(cm)
2.99          0
Biometry

BPD:      81.9  mm     G. Age:  33w 0d          0  %    CI:        74.92   %   70 - 86
FL/HC:      22.7   %   20.8 -
HC:      300.2  mm     G. Age:  33w 2d        < 3  %    HC/AC:      0.87       0.92 -
AC:      345.6  mm     G. Age:  38w 3d         91  %    FL/BPD:     83.0   %   71 - 87
FL:         68  mm     G. Age:  34w 6d          7  %    FL/AC:      19.7   %   20 - 24
HUM:      55.9  mm     G. Age:  32w 4d        < 5  %
CER:      48.4  mm     G. Age:  N/A          > 95  %

CM:        5.9  mm

Est. FW:    2920  gm      6 lb 7 oz     53  %
Gestational Age

LMP:           37w 1d       Date:   10/25/15                 EDD:   07/31/16
U/S Today:     34w 6d                                        EDD:   08/16/16
Best:          37w 1d    Det. By:   LMP  (10/25/15)          EDD:   07/31/16
Anatomy

Cranium:               Appears normal         Aortic Arch:            Not well visualized
Cavum:                 Appears normal         Ductal Arch:            Appears normal
Ventricles:            Appears normal         Diaphragm:              Appears normal
Choroid Plexus:        Appears normal         Stomach:                Appears normal, left
sided
Cerebellum:            Appears normal         Abdomen:                Appears normal
Posterior Fossa:       Appears normal         Abdominal Wall:         Appears nml (cord
insert, abd wall)
Nuchal Fold:           Not applicable (>20    Cord Vessels:           Appears normal (3
wks GA)                                        vessel cord)
Face:                  Orbits nl; profile not Kidneys:                Left Multicystic
well visualized
dysplastic
Lips:                  Not well visualized    Bladder:                Appears normal
Thoracic:              Appears normal         Spine:                  Not well visualized
Heart:                 Appears normal         Upper Extremities:      Visualized
(4CH, axis, and
situs)
RVOT:                  Appears normal         Lower Extremities:      Visualized
LVOT:                  Appears normal

Other:  Technically difficult due to advanced GA and fetal position.
Cervix Uterus Adnexa

Cervix
Not visualized (advanced GA >96wks)

Adnexa:       No abnormality visualized.
Impression

SIUP at 37+1 weeks
Cephalic presentation
Left multicystic dysplastic kidney; normal right kidney
All other detailed fetal anatomy was seen and appeared
normal; limited views of face, AA and spine
Normal amniotic fluid volume
Measurements consistent with LMP dating; EFW at the 53rd
%tile

The US findings were shared with Ms. Don Lolito Onacram. The
implications of a unilateral DON LOLITO kidney were discussed in
detail.
Recommendations

Follow-up as clinically indicated
Routine prenatal care and delivery
Postnatal confirmation of diagnosis
Follow-up with a pediatric urologist
